# Patient Record
Sex: Female | Born: 1975
Health system: Southern US, Community
[De-identification: ages and names within clinical notes are randomized; demographics above are authoritative.]

## PROBLEM LIST (undated history)

## (undated) ENCOUNTER — Ambulatory Visit (HOSPITAL_COMMUNITY)

## (undated) DIAGNOSIS — G4730 Sleep apnea, unspecified: Secondary | ICD-10-CM

## (undated) DIAGNOSIS — N200 Calculus of kidney: Secondary | ICD-10-CM

## (undated) DIAGNOSIS — T7840XA Allergy, unspecified, initial encounter: Secondary | ICD-10-CM

## (undated) DIAGNOSIS — J189 Pneumonia, unspecified organism: Secondary | ICD-10-CM

## (undated) DIAGNOSIS — J159 Unspecified bacterial pneumonia: Secondary | ICD-10-CM

## (undated) DIAGNOSIS — I10 Essential (primary) hypertension: Secondary | ICD-10-CM

## (undated) DIAGNOSIS — Z8489 Family history of other specified conditions: Secondary | ICD-10-CM

## (undated) DIAGNOSIS — J45909 Unspecified asthma, uncomplicated: Secondary | ICD-10-CM

## (undated) DIAGNOSIS — K219 Gastro-esophageal reflux disease without esophagitis: Secondary | ICD-10-CM

## (undated) DIAGNOSIS — M1990 Unspecified osteoarthritis, unspecified site: Secondary | ICD-10-CM

## (undated) DIAGNOSIS — J42 Unspecified chronic bronchitis: Secondary | ICD-10-CM

## (undated) DIAGNOSIS — F419 Anxiety disorder, unspecified: Secondary | ICD-10-CM

## (undated) HISTORY — DX: Allergy, unspecified, initial encounter: T78.40XA

## (undated) HISTORY — PX: WISDOM TOOTH EXTRACTION: SHX21

## (undated) HISTORY — DX: Unspecified osteoarthritis, unspecified site: M19.90

## (undated) HISTORY — PX: CYSTOSCOPY WITH STENT PLACEMENT: SHX5790

## (undated) HISTORY — DX: Sleep apnea, unspecified: G47.30

## (undated) HISTORY — PX: SPINE SURGERY: SHX786

## (undated) HISTORY — DX: Unspecified asthma, uncomplicated: J45.909

## (undated) HISTORY — PX: BACK SURGERY: SHX140

---
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
## 2000-01-02 HISTORY — PX: TONSILLECTOMY: SUR1361

## 2006-01-01 HISTORY — PX: CARPAL TUNNEL RELEASE: SHX101

## 2007-03-25 ENCOUNTER — Encounter: Admission: RE | Admit: 2007-03-25 | Discharge: 2007-03-25 | Payer: Self-pay | Admitting: Family Medicine

## 2008-04-16 ENCOUNTER — Ambulatory Visit (HOSPITAL_COMMUNITY): Admission: RE | Admit: 2008-04-16 | Discharge: 2008-04-16 | Payer: Self-pay | Admitting: Urology

## 2008-07-21 ENCOUNTER — Encounter: Admission: RE | Admit: 2008-07-21 | Discharge: 2008-07-21 | Payer: Self-pay | Admitting: Family Medicine

## 2010-04-12 LAB — BASIC METABOLIC PANEL
Calcium: 9.1 mg/dL (ref 8.4–10.5)
GFR calc Af Amer: >60 mL/min (ref >60)
GFR calc non Af Amer: >60 mL/min (ref >60)
Sodium: 139 mEq/L (ref 135–145)

## 2010-04-12 LAB — CBC
Hemoglobin: 14.5 g/dL (ref 12.0–15.0)
RBC: 4.73 MIL/uL (ref 3.87–5.11)

## 2010-05-16 NOTE — Op Note (Signed)
NAMEJOLYN, Katrina Murphy            ACCOUNT NO.:  [id number]   MEDICAL RECORD NO.:  [id number]          PATIENT TYPE:  AMB   LOCATION:  DAY                          FACILITY:  [facility]   PHYSICIAN:  Ronald L. Earlene Plater, M.D.  DATE OF BIRTH:  02-26-1975   DATE OF PROCEDURE:  04/16/2008  DATE OF DISCHARGE:                               OPERATIVE REPORT   DIAGNOSIS:  Possible ureterolithiasis with left flank pain for 3 days.   OPERATIVE PROCEDURE:  Cystourethroscopy, left ureteroscopy, dilatation  of left distal ureteral stricture and placement of left double-J stent.   SURGEON:  Dr. Gaynelle Arabian.   ANESTHESIA:  General.   ESTIMATED BLOOD LOSS:  Negligible.   TUBE:  A 26 cm 6-French contoured double pigtail stent with a pullout  string.   COMPLICATIONS:  None.   INDICATIONS FOR PROCEDURE:  [name] is a very nice 35 year old  white female who has had multiple kidney stones in the past.  She  presented on Tuesday with left flank pain which was quite severe.  On CT  scan, she had one intrarenal calculus on the left side, but also had a  very small 2-3 mm calcification that was very difficult to discern  between a phlebolith and between the ureteral calculus.  Over the  ensuing 3 days, she has continued to have severe pain that feels just  like kidney stones to her and microhematuria.  Repeat CT scan again had  the calcification which was suspicious.  After understanding risks,  benefits and alternatives, she has elected to proceed with the above  procedure.   PROCEDURE IN DETAIL:  The patient was placed in the supine position.  After proper general anesthesia, she was placed in the dorsal lithotomy  position and prepped and draped with Betadine in a sterile fashion.  Cystourethroscopy was performed with a 22.5 [place] Olympus panendoscope.  The bladder was carefully inspected and noted to be without lesions.  Under fluoroscopic guidance a 0.038 French sensor wire was placed into  the left renal pelvis and the left distal ureter was dilated with the  inner dilating sheath of the ureteral access catheter.  It was noted to  be quite tight just above the ureteral orifice and suspicious for a  narrowing.  Ureteroscopy was then performed with a short thin 6-French  semi-rigid ureteroscope and the lower two-thirds of the ureter was  examined and noted to be without lesions.  The area of the distal ureter  just above the ureterovesical junction was noted to be somewhat inflamed  and either represented with a recent passage of stone or was a ureteral  stricture that was dilated.  Fluoroscopy was performed.  I could see no  stones on it.  The stone could have fragmented or potentially flushed  into the kidney, but I felt no further treatment was necessary at this  point.  The ureteroscope was visually removed.  There were no  perforations or bleeding noted.  Under fluoroscopic guidance, a 26-cm 6-  [place] contoured double  pigtail stent was placed and noted to be in good position within the  left renal  pelvis within the bladder.  A pullout string was attached.  The bladder was drained.  The panendoscope was removed.  Stent  localization was confirmed fluoroscopically and the patient was taken to  the recovery room stable.      Ronald L. Earlene Plater, M.D.  Electronically Signed     RLD/MEDQ  D:  04/16/2008  T:  04/17/2008  Job:  [id number]

## 2010-08-31 ENCOUNTER — Other Ambulatory Visit (HOSPITAL_COMMUNITY)
Admission: RE | Admit: 2010-08-31 | Discharge: 2010-08-31 | Disposition: A | Discharge status: Home / Self Care | Payer: 59 | Source: Ambulatory Visit | Attending: Family Medicine | Admitting: Family Medicine

## 2010-08-31 DIAGNOSIS — Z01419 Encounter for gynecological examination (general) (routine) without abnormal findings: Secondary | ICD-10-CM | POA: Insufficient documentation

## 2011-12-06 ENCOUNTER — Other Ambulatory Visit: Payer: Self-pay | Admitting: Gastroenterology

## 2011-12-06 DIAGNOSIS — R112 Nausea with vomiting, unspecified: Secondary | ICD-10-CM

## 2011-12-13 ENCOUNTER — Ambulatory Visit
Admission: RE | Admit: 2011-12-13 | Discharge: 2011-12-13 | Disposition: A | Discharge status: Home / Self Care | Payer: 59 | Source: Ambulatory Visit | Attending: Gastroenterology | Admitting: Gastroenterology

## 2011-12-13 DIAGNOSIS — R112 Nausea with vomiting, unspecified: Secondary | ICD-10-CM

## 2012-01-09 ENCOUNTER — Other Ambulatory Visit: Payer: 59

## 2012-06-13 ENCOUNTER — Other Ambulatory Visit: Payer: Self-pay | Admitting: Family Medicine

## 2012-06-13 ENCOUNTER — Ambulatory Visit
Admission: RE | Admit: 2012-06-13 | Discharge: 2012-06-13 | Disposition: A | Discharge status: Home / Self Care | Payer: 59 | Source: Ambulatory Visit | Attending: Family Medicine | Admitting: Family Medicine

## 2012-06-13 DIAGNOSIS — R52 Pain, unspecified: Secondary | ICD-10-CM

## 2012-09-05 ENCOUNTER — Other Ambulatory Visit: Payer: Self-pay | Admitting: Internal Medicine

## 2012-09-05 DIAGNOSIS — E270 Other adrenocortical overactivity: Secondary | ICD-10-CM

## 2012-09-12 ENCOUNTER — Other Ambulatory Visit: Payer: 59

## 2012-12-17 ENCOUNTER — Other Ambulatory Visit: Payer: Self-pay | Admitting: Urology

## 2012-12-17 ENCOUNTER — Encounter (HOSPITAL_COMMUNITY): Payer: Self-pay | Admitting: *Deleted

## 2012-12-19 ENCOUNTER — Ambulatory Visit (HOSPITAL_COMMUNITY)
Admission: RE | Admit: 2012-12-19 | Discharge: 2012-12-19 | Disposition: A | Discharge status: Home / Self Care | Payer: 59 | Source: Ambulatory Visit | Attending: Urology | Admitting: Urology

## 2012-12-19 ENCOUNTER — Encounter (HOSPITAL_COMMUNITY)
Admission: RE | Disposition: A | Discharge status: Home / Self Care | Payer: Self-pay | Source: Ambulatory Visit | Attending: Urology

## 2012-12-19 ENCOUNTER — Ambulatory Visit (HOSPITAL_COMMUNITY): Payer: 59 | Admitting: Anesthesiology

## 2012-12-19 ENCOUNTER — Ambulatory Visit (HOSPITAL_COMMUNITY): Payer: 59

## 2012-12-19 ENCOUNTER — Encounter (HOSPITAL_COMMUNITY): Payer: 59 | Admitting: Anesthesiology

## 2012-12-19 ENCOUNTER — Encounter (HOSPITAL_COMMUNITY): Payer: Self-pay | Admitting: *Deleted

## 2012-12-19 DIAGNOSIS — Z881 Allergy status to other antibiotic agents status: Secondary | ICD-10-CM | POA: Insufficient documentation

## 2012-12-19 DIAGNOSIS — E669 Obesity, unspecified: Secondary | ICD-10-CM | POA: Insufficient documentation

## 2012-12-19 DIAGNOSIS — N201 Calculus of ureter: Secondary | ICD-10-CM | POA: Insufficient documentation

## 2012-12-19 DIAGNOSIS — I10 Essential (primary) hypertension: Secondary | ICD-10-CM | POA: Insufficient documentation

## 2012-12-19 DIAGNOSIS — Z87891 Personal history of nicotine dependence: Secondary | ICD-10-CM | POA: Insufficient documentation

## 2012-12-19 DIAGNOSIS — K219 Gastro-esophageal reflux disease without esophagitis: Secondary | ICD-10-CM | POA: Insufficient documentation

## 2012-12-19 HISTORY — PX: HOLMIUM LASER APPLICATION: SHX5852

## 2012-12-19 HISTORY — DX: Gastro-esophageal reflux disease without esophagitis: K21.9

## 2012-12-19 HISTORY — DX: Essential (primary) hypertension: I10

## 2012-12-19 HISTORY — PX: CYSTOSCOPY WITH RETROGRADE PYELOGRAM, URETEROSCOPY AND STENT PLACEMENT: SHX5789

## 2012-12-19 HISTORY — DX: Family history of other specified conditions: Z84.89

## 2012-12-19 HISTORY — DX: Calculus of kidney: N20.0

## 2012-12-19 LAB — CBC
HCT: 40.3 % (ref 36.0–46.0)
Hemoglobin: 13.3 g/dL (ref 12.0–15.0)
MCH: 29.4 pg (ref 26.0–34.0)
MCV: 89.2 fL (ref 78.0–100.0)
RBC: 4.52 MIL/uL (ref 3.87–5.11)

## 2012-12-19 LAB — BASIC METABOLIC PANEL
CO2: 24 mEq/L (ref 19–32)
Calcium: 8.7 mg/dL (ref 8.4–10.5)
Creatinine, Ser: 0.58 mg/dL (ref 0.50–1.10)
Glucose, Bld: 89 mg/dL (ref 70–99)

## 2012-12-19 SURGERY — CYSTOURETEROSCOPY, WITH RETROGRADE PYELOGRAM AND STENT INSERTION
Anesthesia: General | Site: Ureter | Laterality: Left

## 2012-12-19 MED ORDER — KETOROLAC TROMETHAMINE 30 MG/ML IJ SOLN
INTRAMUSCULAR | Status: AC
  Filled 2012-12-19: qty 1

## 2012-12-19 MED ORDER — MEPERIDINE HCL 50 MG/ML IJ SOLN: 6.2500 mg | INTRAMUSCULAR | Status: DC | PRN

## 2012-12-19 MED ORDER — HYDROMORPHONE HCL PF 2 MG/ML IJ SOLN
INTRAMUSCULAR | Status: AC
  Filled 2012-12-19: qty 1

## 2012-12-19 MED ORDER — LACTATED RINGERS IV SOLN
INTRAVENOUS | Status: DC | PRN
  Administered 2012-12-19: 17:00:00 via INTRAVENOUS

## 2012-12-19 MED ORDER — PROPOFOL 10 MG/ML IV BOLUS
INTRAVENOUS | Status: AC
  Filled 2012-12-19: qty 20

## 2012-12-19 MED ORDER — IOHEXOL 300 MG/ML  SOLN
INTRAMUSCULAR | Status: DC | PRN
  Administered 2012-12-19: 15 mL via URETHRAL

## 2012-12-19 MED ORDER — KETOROLAC TROMETHAMINE 30 MG/ML IJ SOLN
INTRAMUSCULAR | Status: DC | PRN
  Administered 2012-12-19: 30 mg via INTRAVENOUS

## 2012-12-19 MED ORDER — MIDAZOLAM HCL 5 MG/5ML IJ SOLN
INTRAMUSCULAR | Status: DC | PRN
  Administered 2012-12-19: 2 mg via INTRAVENOUS

## 2012-12-19 MED ORDER — LIDOCAINE HCL 2 % EX GEL
CUTANEOUS | Status: AC
  Filled 2012-12-19: qty 10

## 2012-12-19 MED ORDER — OXYCODONE HCL 5 MG PO TABS: 5.0000 mg | ORAL_TABLET | Freq: Once | ORAL | Status: DC | PRN

## 2012-12-19 MED ORDER — GENTAMICIN IN SALINE 1.6-0.9 MG/ML-% IV SOLN: 80.0000 mg | INTRAVENOUS | Status: DC

## 2012-12-19 MED ORDER — DEXAMETHASONE SODIUM PHOSPHATE 4 MG/ML IJ SOLN
INTRAMUSCULAR | Status: DC | PRN
  Administered 2012-12-19: 10 mg via INTRAVENOUS

## 2012-12-19 MED ORDER — ONDANSETRON HCL 4 MG/2ML IJ SOLN
INTRAMUSCULAR | Status: DC | PRN
  Administered 2012-12-19: 4 mg via INTRAVENOUS

## 2012-12-19 MED ORDER — HYDROMORPHONE HCL PF 1 MG/ML IJ SOLN
INTRAMUSCULAR | Status: DC | PRN
  Administered 2012-12-19: 0.5 mg via INTRAVENOUS
  Administered 2012-12-19: 1 mg via INTRAVENOUS
  Administered 2012-12-19: 0.5 mg via INTRAVENOUS

## 2012-12-19 MED ORDER — GENTAMICIN SULFATE 40 MG/ML IJ SOLN
5.0000 mg/kg | INTRAVENOUS | Status: AC
  Administered 2012-12-19: 410 mg via INTRAVENOUS
  Filled 2012-12-19: qty 10.25

## 2012-12-19 MED ORDER — PROMETHAZINE HCL 25 MG/ML IJ SOLN: 6.2500 mg | INTRAMUSCULAR | Status: DC | PRN

## 2012-12-19 MED ORDER — CEPHALEXIN 250 MG PO CAPS: 250.0000 mg | ORAL_CAPSULE | Freq: Two times a day (BID) | ORAL | Status: DC

## 2012-12-19 MED ORDER — FENTANYL CITRATE 0.05 MG/ML IJ SOLN
INTRAMUSCULAR | Status: DC | PRN
  Administered 2012-12-19 (×4): 50 ug via INTRAVENOUS

## 2012-12-19 MED ORDER — FENTANYL CITRATE 0.05 MG/ML IJ SOLN
INTRAMUSCULAR | Status: AC
  Filled 2012-12-19: qty 2

## 2012-12-19 MED ORDER — HYDROMORPHONE HCL PF 1 MG/ML IJ SOLN: 0.2500 mg | INTRAMUSCULAR | Status: DC | PRN

## 2012-12-19 MED ORDER — ONDANSETRON HCL 4 MG/2ML IJ SOLN
INTRAMUSCULAR | Status: AC
  Filled 2012-12-19: qty 2

## 2012-12-19 MED ORDER — DEXAMETHASONE SODIUM PHOSPHATE 10 MG/ML IJ SOLN
INTRAMUSCULAR | Status: AC
  Filled 2012-12-19: qty 1

## 2012-12-19 MED ORDER — OXYCODONE HCL 5 MG/5ML PO SOLN
5.0000 mg | Freq: Once | ORAL | Status: DC | PRN
  Filled 2012-12-19: qty 5

## 2012-12-19 MED ORDER — PROPOFOL 10 MG/ML IV BOLUS
INTRAVENOUS | Status: DC | PRN
  Administered 2012-12-19: 100 mg via INTRAVENOUS
  Administered 2012-12-19: 200 mg via INTRAVENOUS

## 2012-12-19 MED ORDER — MIDAZOLAM HCL 2 MG/2ML IJ SOLN
INTRAMUSCULAR | Status: AC
  Filled 2012-12-19: qty 2

## 2012-12-19 SURGICAL SUPPLY — 21 items
BAG URINE DRAINAGE (UROLOGICAL SUPPLIES) ×3 IMPLANT
BASKET LASER NITINOL 1.9FR (BASKET) ×3 IMPLANT
BASKET STNLS GEMINI 4WIRE 3FR (BASKET) IMPLANT
BASKET ZERO TIP NITINOL 2.4FR (BASKET) IMPLANT
CATH INTERMIT  6FR 70CM (CATHETERS) ×3 IMPLANT
DRAPE CAMERA CLOSED 9X96 (DRAPES) ×3 IMPLANT
ELECT REM PT RETURN 9FT ADLT (ELECTROSURGICAL)
ELECTRODE REM PT RTRN 9FT ADLT (ELECTROSURGICAL) IMPLANT
FIBER LASER FLEXIVA 200 (UROLOGICAL SUPPLIES) ×3 IMPLANT
FIBER LASER FLEXIVA 365 (UROLOGICAL SUPPLIES) IMPLANT
GLOVE BIOGEL M STRL SZ7.5 (GLOVE) ×3 IMPLANT
GOWN PREVENTION PLUS LG XLONG (DISPOSABLE) ×3 IMPLANT
GUIDEWIRE ANG ZIPWIRE 038X150 (WIRE) ×6 IMPLANT
GUIDEWIRE STR DUAL SENSOR (WIRE) ×3 IMPLANT
IV NS IRRIG 3000ML ARTHROMATIC (IV SOLUTION) ×3 IMPLANT
PACK CYSTO (CUSTOM PROCEDURE TRAY) ×3 IMPLANT
SHEATH ACCESS URETERAL 24CM (SHEATH) ×3 IMPLANT
STENT POLARIS 5FRX24 (STENTS) ×3 IMPLANT
SYRINGE 10CC LL (SYRINGE) ×3 IMPLANT
SYRINGE IRR TOOMEY STRL 70CC (SYRINGE) IMPLANT
TUBE FEEDING 8FR 16IN STR KANG (MISCELLANEOUS) ×3 IMPLANT

## 2012-12-19 NOTE — Anesthesia Postprocedure Evaluation (Signed)
Anesthesia Post Note  Patient: Katrina Murphy  Procedure(s) Performed: Procedure(s) (LRB): CYSTOSCOPY WITH RETROGRADE PYELOGRAM, URETEROSCOPY AND STENT PLACEMENT (Left) HOLMIUM LASER APPLICATION (Left)  Anesthesia type: General  Patient location: PACU  Post pain: Pain level controlled  Post assessment: Post-op Vital signs reviewed  Last Vitals: BP 154/99  Pulse 69  Temp(Src) 36.5 C (Oral)  Resp 16  Ht 5\' 3"  (1.6 m)  Wt 281 lb 9.6 oz (127.733 kg)  BMI 49.90 kg/m2  SpO2 96%  LMP 12/16/2012  Post vital signs: Reviewed  Level of consciousness: sedated  Complications: No apparent anesthesia complications

## 2012-12-19 NOTE — Transfer of Care (Signed)
Immediate Anesthesia Transfer of Care Note  Patient: Katrina Murphy  Procedure(s) Performed: Procedure(s): CYSTOSCOPY WITH RETROGRADE PYELOGRAM, URETEROSCOPY AND STENT PLACEMENT (Left) HOLMIUM LASER APPLICATION (Left)  Patient Location: PACU  Anesthesia Type:General  Level of Consciousness: awake and alert   Airway & Oxygen Therapy: Patient Spontanous Breathing and Patient connected to face mask oxygen  Post-op Assessment: Report given to PACU RN and Post -op Vital signs reviewed and stable  Post vital signs: Reviewed and stable  Complications: No apparent anesthesia complications

## 2012-12-19 NOTE — Anesthesia Preprocedure Evaluation (Addendum)
Anesthesia Evaluation  Patient identified by MRN, date of birth, ID band Patient awake    Reviewed: Allergy & Precautions, H&P , NPO status , Patient's Chart, lab work & pertinent test results  Airway       Dental  (+) Dental Advisory Given   Pulmonary former smoker,          Cardiovascular hypertension, Pt. on medications     Neuro/Psych negative neurological ROS  negative psych ROS   GI/Hepatic Neg liver ROS, GERD-  ,  Endo/Other  negative endocrine ROSMorbid obesity  Renal/GU Renal disease     Musculoskeletal negative musculoskeletal ROS (+)   Abdominal (+) + obese,   Peds  Hematology negative hematology ROS (+)   Anesthesia Other Findings   Reproductive/Obstetrics negative OB ROS                         Anesthesia Physical Anesthesia Plan  ASA: III  Anesthesia Plan: General   Post-op Pain Management:    Induction: Intravenous  Airway Management Planned: LMA  Additional Equipment:   Intra-op Plan:   Post-operative Plan: Extubation in OR  Informed Consent: I have reviewed the patients History and Physical, chart, labs and discussed the procedure including the risks, benefits and alternatives for the proposed anesthesia with the patient or authorized representative who has indicated his/her understanding and acceptance.   Dental advisory given  Plan Discussed with: CRNA  Anesthesia Plan Comments:        Anesthesia Quick Evaluation

## 2012-12-19 NOTE — H&P (Signed)
Katrina Murphy is an 37 y.o. female.    Chief Complaint: Pre-OP Left Ureteroscopic Stone Manipulation  HPI:  1 -  Recurrent Nephrolithiais - Prior URS in 2003,2007,2010. 12/2012 - Left 5x42mm left proximal ureteral stone with mild-mod hydro. NO additional stones.   2 - Metabolic Stone Disease / Hypercalciuria / Hyperuricosuria -  Eval 2010: 24 hr urine - severe hypercalciuria (>700) and hyperuricosuria --> Allopurinol + HCTZ which she has been compliant with for years. Eval 2014: 24 hr urine - improved hypercalciuria (333) and hyperuicosuria, but still above normal limits  PMH sig for HTN, TNA, Back Surgery, GERD, Obesity. No CV disease. No strong blood thinners.  Today Lakshmi is seen to proceed with left ureteroscopic stone manipulation for her large symptomatic ureteral stone. No interval fevers. Most recent UA without infectious parameters.  Past Medical History  Diagnosis Date  . Hypertension   . GERD (gastroesophageal reflux disease)   . Family history of anesthesia complication     father- severe vomiting   . Kidney stones     Past Surgical History  Procedure Laterality Date  . Cystoscopy      x 4   . Tonsillectomy    . Carpal tunnel release      bilateral   . Wisdom tooth extraction    . Back surgery      lower back     History reviewed. No pertinent family history. Social History:  reports that she has quit smoking. She has never used smokeless tobacco. She reports that she does not drink alcohol or use illicit drugs.  Allergies:  Allergies  Allergen Reactions  . Sulfa Antibiotics Anaphylaxis  . Spironolactone Other (See Comments)    Hypotension, incoherent     No prescriptions prior to admission    No results found for this or any previous visit (from the past 48 hour(s)). No results found.  Review of Systems  Constitutional: Negative.  Negative for fever and chills.  HENT: Negative.   Eyes: Negative.   Respiratory: Negative.   Cardiovascular:  Negative.   Gastrointestinal: Positive for nausea and vomiting.  Genitourinary: Positive for flank pain. Negative for dysuria and urgency.  Musculoskeletal: Negative.   Skin: Negative.   Neurological: Negative.   Endo/Heme/Allergies: Negative.   Psychiatric/Behavioral: Negative.     Last menstrual period 12/16/2012. Physical Exam  Constitutional: She is oriented to person, place, and time. She appears well-developed and well-nourished.  HENT:  Head: Normocephalic and atraumatic.  Eyes: EOM are normal. Pupils are equal, round, and reactive to light.  Neck: Normal range of motion. Neck supple.  Cardiovascular: Normal rate and regular rhythm.   Respiratory: Effort normal and breath sounds normal.  GI: Soft. Bowel sounds are normal.  Obesity limits sensitivity of exam  Genitourinary:  Mod Left CVAT  Musculoskeletal: Normal range of motion.  Neurological: She is alert and oriented to person, place, and time.  Skin: Skin is warm and dry.  Psychiatric: She has a normal mood and affect. Her behavior is normal. Judgment and thought content normal.     Assessment/Plan  1 -  Recurrent Nephrolithiais - large left proximal ureteral stone by CT. Unlikley to pass on its own. SWL not first choice as hard to see on scout images due to body habitus.  We rediscussed ureteroscopic stone manipulation with basketing and laser-lithotripsy in detail.  We rediscussed risks including bleeding, infection, damage to kidney / ureter  bladder, rarely loss of kidney. We rediscussed anesthetic risks and rare but serious surgical  complications including DVT, PE, MI, and mortality. We specifically readdressed that in 5-10% of cases a staged approach is required with stenting followed by re-attempt ureteroscopy if anatomy unfavorable. The patient voiced understanding and wises to proceed today as planned.    2 - Metabolic Stone Disease / Hypercalciuria / Hyperuricosuria - WIll increase HCTZ and Allopurinol pending  current stoen composition based on 24 hr urines this year.   Parys Elenbaas 12/19/2012, 6:16 AM

## 2012-12-19 NOTE — Brief Op Note (Signed)
12/19/2012  6:01 PM  PATIENT:  Katrina Murphy  37 y.o. female  PRE-OPERATIVE DIAGNOSIS:  LEFT URETERAL STONE  POST-OPERATIVE DIAGNOSIS:  LEFT URETERAL STONE  PROCEDURE:  Procedure(s): CYSTOSCOPY WITH RETROGRADE PYELOGRAM, URETEROSCOPY AND STENT PLACEMENT (Left) HOLMIUM LASER APPLICATION (Left)  SURGEON:  Surgeon(s) and Role:    * Sebastian Ache, MD - Primary  PHYSICIAN ASSISTANT:   ASSISTANTS: none   ANESTHESIA:   general  EBL:     BLOOD ADMINISTERED:none  DRAINS: none   LOCAL MEDICATIONS USED:  NONE  SPECIMEN:  Source of Specimen:  Lt Ureteral Stone Fragments  DISPOSITION OF SPECIMEN:  [facility] for compositional analysis  COUNTS:  YES  TOURNIQUET:  * No tourniquets in log *  DICTATION: .Other Dictation: Dictation Number [id number]  PLAN OF CARE: Discharge to home after PACU  PATIENT DISPOSITION:  PACU - hemodynamically stable.   Delay start of Pharmacological VTE agent (>24hrs) due to surgical blood loss or risk of bleeding: not applicable

## 2012-12-20 NOTE — Op Note (Signed)
NAMEGRISELLE, RUFER            ACCOUNT NO.:  [id number]  MEDICAL RECORD NO.:  [id number]  LOCATION:  [facility]                         FACILITY:  [facility]  PHYSICIAN:  Sebastian Ache, MD     DATE OF BIRTH:  1975-06-11  DATE OF PROCEDURE: 12/19/2012 DATE OF DISCHARGE:  12/19/2012                              OPERATIVE REPORT   DIAGNOSES:  Left ureteral stone refractory renal colic.  PROCEDURE: 1. Cystoscopy with left retrograde pyelogram and interpretation. 2. Left ureteroscopy with laser lithotripsy. 3. Insertion of left ureteral stent, 5 x 24 with tether to the thigh.  ESTIMATED BLOOD LOSS:  Nil.  COMPLICATIONS:  None.  SPECIMENS:  Left ureteral stone for compositional analysis.  FINDINGS: 1. Unremarkable urinary bladder. 2. Left retrograde pyelogram filling defect in proximal ureter     consistent with known stone. 3. Proximal mild hydroureteronephrosis. 4. No additional left sided renal / ureteral calculi.  INDICATION:  Ms. Fehnel is a pleasant 37 year old lady with history of recurrent nephrolithiasis as well as hypercalciuria and hyperuricosuria. She presented earlier this week with acute left renal colic.  CT scan corroborated a large void left ureteral stone.  Options were discussed including medical therapy versus shockwave lithotripsy versus ureteroscopy and she adamantly wished to proceed with the latter. Informed consent was obtained and placed in medical record.  PROCEDURE IN DETAIL:  The patient being Yamari Ventola was verified. Procedure being left ureteroscopic stone manipulation was confirmed. The procedure was carried out.  Time-out was performed.  Intravenous antibiotics administered.  General LMA anesthesia was introduced.  The patient was placed into a low lithotomy position.  Sterile field was created by prepping and draping the patient's vagina, introitus, and proximal thighs using iodine x3.  Next, cystourethroscopy was performed using 22-French  rigid cystoscope with 12-degree offset lens.  Inspection of the urinary bladder revealed no diverticula, calcifications, papular lesions.  The left ureteral orifice was cannulated with a 6-French end- hole catheter and left retrograde pyelogram was obtained.  Left retrograde pyelogram demonstrated a single left ureter with single system left kidney.  There was a filling defect in proximal ureter consistent with known stone.  There was mild-to-moderate hydroureteronephrosis above this, a 0.038 Glidewire was advanced at the level of the upper pole and set aside as a safety wire.  An 8-French draining tube was placed in the urinary bladder for pressure release. Next, semi-rigid ureteroscopy was performed in the distal two-thirds of the left ureter alongside a separate Sensor working wire at the maximum regions of the semi-rigid ureteroscope.  The proximal ureteral stone in question was indeed seen.  Due to being in the limit of the scope, we felt that flexible technique would be more advantageous as such the semi- rigid ureteroscope was exchanged for a 24 cm 12/14 ureteral access sheath, which was placed over the sensor working wire using continuous fluoroscopic guidance at the level of the proximal ureter.  Next, flexible digital ureteroscopy was performed using 8-French digital ureteroscope.  The stone in question was found and it was appeared to be much too large for simple basketing as such, holmium laser energy was applied to the stone using settings of 0.6 joules and 6 hertz fragmenting the stone  approximately 4 smaller pieces.  These were each grasped with escape basket and brought out in their entirety set aside for compositional analysis.  Systematic inspection above this stone including inspection of all renal calices x2 revealed no additional stone fragments.  There was no evidence of perforation.  There was excellent hemostasis.  The sheath was removed under  continuous ureteroscopic vision and no mucosal abnormalities were found.  Finally, a new 5 x 24 Polaris stent was placed through the remaining safety wire and good proximal and distal curl were noted.  Bladder was emptied per cystoscope.  Procedure was then terminated.  The patient tolerated the procedure well.  There were no immediate periprocedural complications. The patient was taken to the Postanesthesia Care Unit in stable condition.          ______________________________ Sebastian Ache, MD     TM/MEDQ  D:  12/19/2012  T:  12/20/2012  Job:  [id number]

## 2012-12-22 ENCOUNTER — Encounter (HOSPITAL_COMMUNITY): Payer: Self-pay | Admitting: Urology

## 2014-02-08 ENCOUNTER — Ambulatory Visit
Admission: RE | Admit: 2014-02-08 | Discharge: 2014-02-08 | Disposition: A | Discharge status: Home / Self Care | Payer: 59 | Source: Ambulatory Visit | Attending: Family | Admitting: Family

## 2014-02-08 ENCOUNTER — Other Ambulatory Visit: Payer: Self-pay | Admitting: Family

## 2014-02-08 DIAGNOSIS — R059 Cough, unspecified: Secondary | ICD-10-CM

## 2014-02-08 DIAGNOSIS — R0989 Other specified symptoms and signs involving the circulatory and respiratory systems: Secondary | ICD-10-CM

## 2014-02-08 DIAGNOSIS — R05 Cough: Secondary | ICD-10-CM

## 2014-02-08 DIAGNOSIS — J159 Unspecified bacterial pneumonia: Secondary | ICD-10-CM

## 2014-02-08 HISTORY — DX: Unspecified bacterial pneumonia: J15.9

## 2014-02-16 ENCOUNTER — Inpatient Hospital Stay (HOSPITAL_COMMUNITY)
Admission: AD | Admit: 2014-02-16 | Discharge: 2014-02-18 | DRG: 195 | Disposition: A | Discharge status: Home / Self Care | Payer: 59 | Source: Ambulatory Visit | Attending: Internal Medicine | Admitting: Internal Medicine

## 2014-02-16 ENCOUNTER — Inpatient Hospital Stay (HOSPITAL_COMMUNITY): Payer: 59

## 2014-02-16 ENCOUNTER — Encounter (HOSPITAL_COMMUNITY): Payer: Self-pay | Admitting: General Practice

## 2014-02-16 DIAGNOSIS — E790 Hyperuricemia without signs of inflammatory arthritis and tophaceous disease: Secondary | ICD-10-CM

## 2014-02-16 DIAGNOSIS — F329 Major depressive disorder, single episode, unspecified: Secondary | ICD-10-CM | POA: Diagnosis present

## 2014-02-16 DIAGNOSIS — K219 Gastro-esophageal reflux disease without esophagitis: Secondary | ICD-10-CM

## 2014-02-16 DIAGNOSIS — J189 Pneumonia, unspecified organism: Secondary | ICD-10-CM | POA: Diagnosis present

## 2014-02-16 DIAGNOSIS — I10 Essential (primary) hypertension: Secondary | ICD-10-CM

## 2014-02-16 DIAGNOSIS — F419 Anxiety disorder, unspecified: Secondary | ICD-10-CM | POA: Diagnosis present

## 2014-02-16 DIAGNOSIS — N200 Calculus of kidney: Secondary | ICD-10-CM | POA: Diagnosis present

## 2014-02-16 DIAGNOSIS — J42 Unspecified chronic bronchitis: Secondary | ICD-10-CM | POA: Diagnosis present

## 2014-02-16 DIAGNOSIS — Z87891 Personal history of nicotine dependence: Secondary | ICD-10-CM | POA: Diagnosis not present

## 2014-02-16 DIAGNOSIS — J45909 Unspecified asthma, uncomplicated: Secondary | ICD-10-CM | POA: Diagnosis present

## 2014-02-16 DIAGNOSIS — J45901 Unspecified asthma with (acute) exacerbation: Secondary | ICD-10-CM

## 2014-02-16 HISTORY — DX: Unspecified bacterial pneumonia: J15.9

## 2014-02-16 HISTORY — DX: Unspecified chronic bronchitis: J42

## 2014-02-16 HISTORY — DX: Pneumonia, unspecified organism: J18.9

## 2014-02-16 HISTORY — DX: Anxiety disorder, unspecified: F41.9

## 2014-02-16 LAB — BASIC METABOLIC PANEL
ANION GAP: 6 (ref 5–15)
BUN: 8 mg/dL (ref 6–23)
CALCIUM: 9.1 mg/dL (ref 8.4–10.5)
CO2: 28 mmol/L (ref 19–32)
Chloride: 105 mmol/L (ref 96–112)
Creatinine, Ser: 0.82 mg/dL (ref 0.50–1.10)
GFR, EST NON AFRICAN AMERICAN: 90 mL/min — AB (ref >90)
Glucose, Bld: 98 mg/dL (ref 70–99)
Potassium: 6.3 mmol/L (ref 3.5–5.1)
SODIUM: 139 mmol/L (ref 135–145)

## 2014-02-16 LAB — URINALYSIS, ROUTINE W REFLEX MICROSCOPIC
GLUCOSE, UA: NEGATIVE mg/dL
Ketones, ur: 15 mg/dL — AB
Leukocytes, UA: NEGATIVE
Nitrite: NEGATIVE
PH: 6 (ref 5.0–8.0)
Protein, ur: NEGATIVE mg/dL
Specific Gravity, Urine: 1.028 (ref 1.005–1.030)
Urobilinogen, UA: 0.2 mg/dL (ref 0.0–1.0)

## 2014-02-16 LAB — CBC
HEMATOCRIT: 41.5 % (ref 36.0–46.0)
Hemoglobin: 13.8 g/dL (ref 12.0–15.0)
MCH: 29.4 pg (ref 26.0–34.0)
MCHC: 33.3 g/dL (ref 30.0–36.0)
MCV: 88.5 fL (ref 78.0–100.0)
Platelets: 298 10*3/uL (ref 150–400)
RBC: 4.69 MIL/uL (ref 3.87–5.11)
RDW: 13.5 % (ref 11.5–15.5)
WBC: 10.3 10*3/uL (ref 4.0–10.5)

## 2014-02-16 LAB — URINE MICROSCOPIC-ADD ON

## 2014-02-16 LAB — STREP PNEUMONIAE URINARY ANTIGEN: Strep Pneumo Urinary Antigen: NEGATIVE

## 2014-02-16 MED ORDER — ONDANSETRON HCL 4 MG/2ML IJ SOLN
4.0000 mg | Freq: Four times a day (QID) | INTRAMUSCULAR | Status: DC | PRN
  Administered 2014-02-17 (×2): 4 mg via INTRAVENOUS
  Filled 2014-02-16 (×2): qty 2

## 2014-02-16 MED ORDER — ALLOPURINOL 100 MG PO TABS
100.0000 mg | ORAL_TABLET | Freq: Every day | ORAL | Status: DC
  Administered 2014-02-17 – 2014-02-18 (×2): 100 mg via ORAL
  Filled 2014-02-16 (×3): qty 1

## 2014-02-16 MED ORDER — LISINOPRIL 10 MG PO TABS
10.0000 mg | ORAL_TABLET | Freq: Every day | ORAL | Status: DC
  Administered 2014-02-17 – 2014-02-18 (×2): 10 mg via ORAL
  Filled 2014-02-16 (×3): qty 1

## 2014-02-16 MED ORDER — GUAIFENESIN ER 600 MG PO TB12
600.0000 mg | ORAL_TABLET | Freq: Two times a day (BID) | ORAL | Status: DC
  Administered 2014-02-16 – 2014-02-18 (×4): 600 mg via ORAL
  Filled 2014-02-16 (×4): qty 1

## 2014-02-16 MED ORDER — IPRATROPIUM-ALBUTEROL 0.5-2.5 (3) MG/3ML IN SOLN: 3.0000 mL | RESPIRATORY_TRACT | Status: DC | PRN

## 2014-02-16 MED ORDER — GUAIFENESIN 100 MG/5ML PO SYRP
200.0000 mg | ORAL_SOLUTION | ORAL | Status: DC | PRN
  Administered 2014-02-16: 200 mg via ORAL
  Filled 2014-02-16 (×3): qty 10

## 2014-02-16 MED ORDER — CEFTRIAXONE SODIUM IN DEXTROSE 20 MG/ML IV SOLN
1.0000 g | INTRAVENOUS | Status: DC
  Administered 2014-02-16: 1 g via INTRAVENOUS
  Filled 2014-02-16 (×4): qty 50

## 2014-02-16 MED ORDER — NORGESTIM-ETH ESTRAD TRIPHASIC 0.18/0.215/0.25 MG-35 MCG PO TABS: 1.0000 | ORAL_TABLET | Freq: Every day | ORAL | Status: DC

## 2014-02-16 MED ORDER — HYDROCODONE-ACETAMINOPHEN 5-325 MG PO TABS
1.0000 | ORAL_TABLET | Freq: Four times a day (QID) | ORAL | Status: DC | PRN
  Administered 2014-02-17: 1 via ORAL
  Filled 2014-02-16: qty 1

## 2014-02-16 MED ORDER — VITAMIN C 500 MG PO TABS
500.0000 mg | ORAL_TABLET | Freq: Every day | ORAL | Status: DC
  Administered 2014-02-17 – 2014-02-18 (×2): 500 mg via ORAL
  Filled 2014-02-16 (×3): qty 1

## 2014-02-16 MED ORDER — SODIUM CHLORIDE 0.9 % IV SOLN
INTRAVENOUS | Status: DC
  Administered 2014-02-16 – 2014-02-17 (×2): via INTRAVENOUS

## 2014-02-16 MED ORDER — HYDROCOD POLST-CHLORPHEN POLST 10-8 MG/5ML PO LQCR
5.0000 mL | Freq: Every day | ORAL | Status: DC
Start: 2014-02-16 — End: 2014-02-17
  Administered 2014-02-16: 5 mL via ORAL
  Filled 2014-02-16: qty 5

## 2014-02-16 MED ORDER — PANTOPRAZOLE SODIUM 20 MG PO TBEC
20.0000 mg | DELAYED_RELEASE_TABLET | Freq: Every day | ORAL | Status: DC
  Administered 2014-02-17 – 2014-02-18 (×2): 20 mg via ORAL
  Filled 2014-02-16 (×3): qty 1

## 2014-02-16 MED ORDER — MENTHOL 3 MG MT LOZG
1.0000 | LOZENGE | OROMUCOSAL | Status: DC | PRN
  Filled 2014-02-16: qty 9

## 2014-02-16 MED ORDER — ALBUTEROL SULFATE (2.5 MG/3ML) 0.083% IN NEBU
2.5000 mg | INHALATION_SOLUTION | RESPIRATORY_TRACT | Status: DC | PRN
  Administered 2014-02-17: 2.5 mg via RESPIRATORY_TRACT
  Filled 2014-02-16: qty 3

## 2014-02-16 MED ORDER — ACETAMINOPHEN 325 MG PO TABS
650.0000 mg | ORAL_TABLET | Freq: Four times a day (QID) | ORAL | Status: DC | PRN
  Administered 2014-02-17: 325 mg via ORAL
  Administered 2014-02-17: 650 mg via ORAL
  Filled 2014-02-16 (×2): qty 2

## 2014-02-16 MED ORDER — ESCITALOPRAM OXALATE 10 MG PO TABS
10.0000 mg | ORAL_TABLET | Freq: Every day | ORAL | Status: DC
  Administered 2014-02-17 – 2014-02-18 (×2): 10 mg via ORAL
  Filled 2014-02-16 (×3): qty 1

## 2014-02-16 MED ORDER — ENOXAPARIN SODIUM 40 MG/0.4ML ~~LOC~~ SOLN
40.0000 mg | SUBCUTANEOUS | Status: DC
  Administered 2014-02-16 – 2014-02-17 (×2): 40 mg via SUBCUTANEOUS
  Filled 2014-02-16 (×2): qty 0.4

## 2014-02-16 MED ORDER — DEXTROSE 5 % IV SOLN
500.0000 mg | INTRAVENOUS | Status: DC
  Administered 2014-02-16: 500 mg via INTRAVENOUS
  Filled 2014-02-16 (×4): qty 500

## 2014-02-16 NOTE — Progress Notes (Signed)
Pt admitted to the unit. MD notified of pt's arrival and awaiting on orders.

## 2014-02-16 NOTE — H&P (Signed)
[facility] History and Physical                                                                                    Katrina Murphy, is a 39 y.o. female  MRN: [id number]   DOB - 1975/07/13  Admit Date - 02/16/2014  Outpatient Primary MD for the patient is FULP, CAMMIE, MD  With History of -  Past Medical History  Diagnosis Date  . Hypertension   . GERD (gastroesophageal reflux disease)   . Kidney stones   . Family history of anesthesia complication     father- severe vomiting   . Pneumonia 1980's X 1; 2000  . Bacterial pneumonia 02/08/2014  . Chronic bronchitis     "got it q yr when I smoked" (02/16/2014)  . Anxiety       Past Surgical History  Procedure Laterality Date  . Cystoscopy with stent placement  ~ 2004; ~ 2006; ~ 2010    "took all the stents out myself" (02/16/2014)  . Tonsillectomy  2002    "one has grown back"  . Carpal tunnel release Bilateral 2008  . Wisdom tooth extraction    . Back surgery    . Cystoscopy with retrograde pyelogram, ureteroscopy and stent placement Left 12/19/2012    Procedure: CYSTOSCOPY WITH RETROGRADE PYELOGRAM, URETEROSCOPY AND STENT PLACEMENT;  Surgeon: Sebastian Ache, MD;  Location: [facility];  Service: Urology;  Laterality: Left;  . Holmium laser application Left 12/19/2012    Procedure: HOLMIUM LASER APPLICATION;  Surgeon: Sebastian Ache, MD;  Location: [facility];  Service: Urology;  Laterality: Left;  . Lumbar disc surgery  1992    "removed herniated pieces L4-5"    in for   No chief complaint on file.    HPI  Katrina Murphy  is a 39 y.o. female, with a PMH of tobacco abuse, GERD, and kidney stones who has been coughing green and brown phlegm for 2 weeks.  She received a chest x-ray at her PCPs office and was found to have left middle lobe pneumonia 1 week ago. She was placed on Levaquin 750 mg daily. Yesterday she returned her primary care physician's office. Her symptoms were the same or worse.  She was found to have  rhonchorous breath sounds, still coughing up dark sputum now streaked with blood. The patient reports losing 13 pounds in the past 10 days. She states her urine is dark.   Review of Systems   In addition to the HPI above,  No sick contacts  No changes with Vision or hearing, No problems swallowing food or Liquids, + Pleuritic chest pain  + Posttussive vomiting  No Blood in stool or Urine, No dysuria, No new skin rashes or bruises, No new joints pains-aches,  No new weakness, tingling, numbness in any extremity,  A full 10 point Review of Systems was done, except as stated above, all other Review of Systems were negative.  Social History History  Substance Use Topics  . Smoking status: Former Smoker -- 0.50 packs/day for 15 years    Types: Cigarettes    Quit date: 01/07/2009  . Smokeless tobacco: Never Used  . Alcohol Use: Yes  Comment: 02/16/2014 "might drink 1 drink/month; if that"   she smoked one half pack a day from the time she was 17 until she was 32. She denies recreational drug use. She admits to rare alcohol use   she lives at home alone, and works for [employer]  Family History There is no known history of lung disease in the family   Prior to Admission medications   Medication Sig Start Date End Date Taking? Authorizing Provider  albuterol (PROVENTIL HFA;VENTOLIN HFA) 108 (90 BASE) MCG/ACT inhaler Inhale 2 puffs into the lungs every 4 (four) hours as needed for wheezing or shortness of breath.   Yes Historical Provider, MD  allopurinol (ZYLOPRIM) 100 MG tablet Take 100 mg by mouth daily. Patient takes in am   Yes Historical Provider, MD  benzonatate (TESSALON) 100 MG capsule Take 100 mg by mouth 3 (three) times daily as needed for cough.   Yes Historical Provider, MD  cephALEXin (KEFLEX) 500 MG capsule Take 500 mg by mouth 2 (two) times daily.   Yes Historical Provider, MD  escitalopram (LEXAPRO) 10 MG tablet Take 10 mg by mouth daily.   Yes Historical  Provider, MD  hydrochlorothiazide (HYDRODIURIL) 25 MG tablet Take 25 mg by mouth daily. Patient takes in am   Yes Historical Provider, MD  HYDROcodone-acetaminophen (NORCO/VICODIN) 5-325 MG per tablet Take 1 tablet by mouth every 6 (six) hours as needed for moderate pain (back trouble).   Yes Historical Provider, MD  ibuprofen (ADVIL,MOTRIN) 200 MG tablet Take 200 mg by mouth every 6 (six) hours as needed.   Yes Historical Provider, MD  lansoprazole (PREVACID) 15 MG capsule Take 15 mg by mouth daily at 12 noon.   Yes Historical Provider, MD  levofloxacin (LEVAQUIN) 750 MG tablet Take 750 mg by mouth daily. Take 1 tablet once a day starting 7 days .   Yes Historical Provider, MD  lisinopril (PRINIVIL,ZESTRIL) 10 MG tablet Take 10 mg by mouth daily. Patient takes in am   Yes Historical Provider, MD  Multiple Vitamin (MULTIVITAMIN) tablet Take 1 tablet by mouth daily.   Yes Historical Provider, MD  PRESCRIPTION MEDICATION Apply 1 application topically 2 (two) times daily as needed (for acne outbreak).   Yes Historical Provider, MD  valACYclovir (VALTREX) 1000 MG tablet Take 1,000 mg by mouth 2 (two) times daily. Take 1000 mg twice daily for 7 days for cold sore.   Yes Historical Provider, MD  vitamin C (ASCORBIC ACID) 500 MG tablet Take 500 mg by mouth daily.   Yes Historical Provider, MD  cephALEXin (KEFLEX) 250 MG capsule Take 1 capsule (250 mg total) by mouth 2 (two) times daily. X 4 days to prevent post-op infection. Patient not taking: Reported on 02/16/2014 12/19/12   Sebastian Acheheodore Manny, MD  [facility](ORTHO TRI-CYCLEN, 28, PO) Take 1 tablet by mouth daily.     Historical Provider, MD  ondansetron (ZOFRAN) 4 MG tablet Take 4 mg by mouth every 8 (eight) hours as needed for nausea or vomiting.    Historical Provider, MD    Allergies  Allergen Reactions  . Sulfa Antibiotics Anaphylaxis  . Sucralose Other (See Comments)    Pt experience headache, N/V with all artificial  sweeteners.   Marland Kitchen. Spironolactone Other (See Comments)    Hypotension, incoherent     Physical Exam  Vitals  Blood pressure 148/88, pulse 89, temperature 98.9 F (37.2 C), temperature source Oral, resp. rate 20, last menstrual period 02/12/2014, SpO2 94 %.   General:  well-developed, obese, pleasant female, lying in bed in NAD  Psych:  Normal affect and insight, Not Suicidal or Homicidal, Awake Alert, Oriented X 3.  Neuro:   No F.N deficits, ALL C.Nerves Intact, Strength 5/5 all 4 extremities, Sensation intact all 4 extremities.  ENT:  Ears and Eyes appear Normal, Conjunctivae clear, PER. Oropharynx is red without exudates. She has moist mucous membranes.   Neck:  Supple, No lymphadenopathy appreciated  Respiratory:  Symmetrical chest wall movement, Good air movement bilaterally, CTAB.  Cardiac:  RRR, No Murmurs, no LE edema noted, no JVD.    Abdomen:  Positive bowel sounds, Soft, Non tender, Non distended,  No masses appreciated  Skin:  No Cyanosis, Normal Skin Turgor, No Skin Rash or Bruise.  Extremities:  Able to move all 4. 5/5 strength in each,  no effusions.  Data Review In  Imaging results:   Dg Chest 2 View  02/08/2014   CLINICAL DATA:  Cough and congestion .  EXAM: CHEST  2 VIEW  COMPARISON:  None.  FINDINGS: Mediastinum hilar structures are normal. Left mid lung field infiltrate noted consistent pneumonia. Right lung clear. No pleural effusion or pneumothorax. Heart size normal. Pulmonary vascularity normal.  IMPRESSION: Left mid lung field acute infiltrate consistent with pneumonia .   Electronically Signed   By: Maisie Fus  Register   On: 02/08/2014 14:23       Assessment & Plan  Active Problems:   Pneumonia   Kidney stones   Hypertension   Chronic bronchitis   GERD (gastroesophageal reflux disease)   Anxiety  Community acquired pneumonia Failed outpatient treatment with Levaquin. Will admit under the pneumonia focused order set for IV antibiotics, repeat  chest x-ray and supportive care. CBC, bmet, UA are pending in addition to the pneumonia order set lab work. Supportive care with nebulizers, mucolytics, cough suppressant  Kidney stones Continue allopurinol  Hypertension Continue lisinopril    GERD Placed on PPI therapy.   DVT Prophylaxis: Lovenox   Family Communication:   patient is alert and oriented, and understands her plan of care   Code Status:   full code   Condition:  guarded but stable   Time spent in minutes : [address],  PA-C on 02/16/2014 at 6:01 PM  Between 7am to 7pm - Pager - [phone]  After 7pm go to [email or web address] - password [email or web address]  And look for the night coverage person covering me after hours  [facility]

## 2014-02-17 LAB — INFLUENZA PANEL BY PCR (TYPE A & B)
H1N1 flu by pcr: NOT DETECTED
Influenza A By PCR: NEGATIVE
Influenza B By PCR: NEGATIVE

## 2014-02-17 LAB — BASIC METABOLIC PANEL
Anion gap: 4 — ABNORMAL LOW (ref 5–15)
BUN: <5 mg/dL — ABNORMAL LOW (ref 6–23)
CO2: 29 mmol/L (ref 19–32)
Calcium: 8.4 mg/dL (ref 8.4–10.5)
Chloride: 105 mmol/L (ref 96–112)
Creatinine, Ser: 0.75 mg/dL (ref 0.50–1.10)
GFR calc Af Amer: >90 mL/min (ref >90)
GFR calc non Af Amer: >90 mL/min (ref >90)
Glucose, Bld: 91 mg/dL (ref 70–99)
Potassium: 3.6 mmol/L (ref 3.5–5.1)
Sodium: 138 mmol/L (ref 135–145)

## 2014-02-17 LAB — LEGIONELLA ANTIGEN, URINE

## 2014-02-17 LAB — EXPECTORATED SPUTUM ASSESSMENT W GRAM STAIN, RFLX TO RESP C

## 2014-02-17 LAB — EXPECTORATED SPUTUM ASSESSMENT W REFEX TO RESP CULTURE

## 2014-02-17 MED ORDER — HYDROCOD POLST-CHLORPHEN POLST 10-8 MG/5ML PO LQCR
5.0000 mL | Freq: Two times a day (BID) | ORAL | Status: DC
  Administered 2014-02-17 – 2014-02-18 (×3): 5 mL via ORAL
  Filled 2014-02-17 (×3): qty 5

## 2014-02-17 MED ORDER — BUDESONIDE 0.5 MG/2ML IN SUSP
0.2500 mg | Freq: Two times a day (BID) | RESPIRATORY_TRACT | Status: DC
  Administered 2014-02-17 – 2014-02-18 (×3): 0.25 mg via RESPIRATORY_TRACT
  Filled 2014-02-17 (×2): qty 2

## 2014-02-17 MED ORDER — BENZONATATE 100 MG PO CAPS
100.0000 mg | ORAL_CAPSULE | Freq: Three times a day (TID) | ORAL | Status: DC
  Administered 2014-02-17 – 2014-02-18 (×4): 100 mg via ORAL
  Filled 2014-02-17 (×4): qty 1

## 2014-02-17 NOTE — Progress Notes (Signed)
CRITICAL VALUE ALERT  Critical value received:  Potassium=6.3  Date of notification:  02/16/14  Time of notification:  2143  Critical value read back:Yes.    Nurse who received alert:  Fabio Neighborselso Talecia Sherlin   MD notified (1st page):  Kirtland BouchardK. Schorr   Time of first page:  0200  MD notified (2nd page):  Time of second page:  Responding MD:  Merdis DelayK. Schorr  Time MD responded:  [id number]  New order: redraw blood for BMP STAT Phlebotomist made aware.

## 2014-02-17 NOTE — Progress Notes (Signed)
Nutrition Brief Note  Patient identified on the Malnutrition Screening Tool (MST) Report  Wt Readings from Last 15 Encounters:  02/17/14 285 lb (129.275 kg)  12/19/12 281 lb 9.6 oz (127.733 kg)   Katrina FailMichele Clair is a 39 y.o. female, with a PMH of tobacco abuse, GERD, and kidney stones who has been coughing green and brown phlegm for 2 weeks. She received a chest x-ray at her PCPs office and was found to have left middle lobe pneumonia 1 week ago.   Pt sleeping soundly at time of visit. Wt hx reveals UBW of 281# approximately one year ago as stable. No documented meal intake available, but noted multiple beverages at bedside.   Body mass index is 50.5 kg/(m^2). Patient meets criteria for extreme obesity, class III based on current BMI.   Current diet order is Heart Healthy, patient is consuming approximately n/a% of meals at this time. Labs and medications reviewed.   No nutrition interventions warranted at this time. If nutrition issues arise, please consult RD.   Denny Mccree A. Mayford KnifeWilliams, RD, LDN, CDE Pager: [phone] After hours Pager: [phone]

## 2014-02-17 NOTE — Progress Notes (Signed)
PT was ID using MRN, birth date and name

## 2014-02-17 NOTE — Progress Notes (Signed)
[facility] Progress Note   Katrina FailMichele Quant UJW:[id number] DOB: Aug 24, 1975 DOA: 02/16/2014 PCP: Cain SaupeFULP, CAMMIE, MD  Brief narrative: Katrina Murphy is a 39 y.o. female with a PMH of tobacco abuse, GERD, and kidney stones who has been coughing green and brown phlegm for 2 weeks. She received a chest x-ray at her PCPs office and was found to have left middle lobe pneumonia 1 week ago. She was placed on Levaquin 750 mg daily and has completed a 9 day course. Although she is no longer bringing up phlegm, her cough is very severe. She was admitted for failure of outpt treatment.    Subjective: Continues to have a severe cough and intermittent wheeze. No sore throat, chest pain or fevers.   Assessment/Plan: Principal Problem:   Pneumonia- severe cough - repeat CXR reveals resolution of the pneumonia - I am suspecting she has a residual inflammatory cough and have added Pulmicort nebs, Tussionex and Tessalon Perles to help alleviate the cough- will d/c antibiotics as there is not sign of ongoing infection - she can have duo-nebs as needed for  Wheezing - she has also been started on a PPI on admission in case GERD is causing ongoing cough - need to consider this may be whooping cough- this would require supportive care as we are doing at this time  Active Problems:  Hypertension Continue lisinopril     Code Status: full code Family Communication:  Disposition Plan: home when stable- hopefully tomorrow if symptoms improve DVT prophylaxis: Lovenox  Consultants:   Procedures:   Antibiotics: Anti-infectives    Start     Dose/Rate Route Frequency Ordered Stop   02/16/14 1800  cefTRIAXone (ROCEPHIN) 1 g in dextrose 5 % 50 mL IVPB - Premix  Status:  Discontinued     1 g 100 mL/hr over 30 Minutes Intravenous Every 24 hours 02/16/14 1755 02/17/14 0823   02/16/14 1800  azithromycin (ZITHROMAX) 500 mg in dextrose 5 % 250 mL IVPB  Status:  Discontinued     500 mg 250 mL/hr over 60  Minutes Intravenous Every 24 hours 02/16/14 1755 02/17/14 0823         Objective: Filed Weights   02/17/14 0835  Weight: 129.275 kg (285 lb)    Intake/Output Summary (Last 24 hours) at 02/17/14 1222 Last data filed at 02/17/14 0626  Gross per 24 hour  Intake 1423.75 ml  Output    500 ml  Net 923.75 ml     Vitals Filed Vitals:   02/16/14 2257 02/17/14 0605 02/17/14 0835 02/17/14 0923  BP: 146/86 122/64    Pulse: 93 91  92  Temp: 98.4 F (36.9 C) 98.6 F (37 C)    TempSrc: Oral Oral    Resp: 16 16  16   Height:   5\' 3"  (1.6 m)   Weight:   129.275 kg (285 lb)   SpO2: 96% 92%  93%    Exam: General: AAO x3, No acute respiratory distress Lungs: Clear to auscultation bilaterally without wheezes or crackles- has a severe cough which even limits her talking Cardiovascular: Regular rate and rhythm without murmur gallop or rub normal S1 and S2 Abdomen: Nontender, nondistended, soft, bowel sounds positive, no rebound, no ascites, no appreciable mass Extremities: No significant cyanosis, clubbing, or edema bilateral lower extremities  Data Reviewed: Basic Metabolic Panel:  Recent Labs Lab 02/16/14 2038 02/17/14 0223  NA 139 138  K 6.3* 3.6  CL 105 105  CO2 28 29  GLUCOSE 98 91  BUN 8 <  5*  CREATININE 0.82 0.75  CALCIUM 9.1 8.4   Liver Function Tests: No results for input(s): AST, ALT, ALKPHOS, BILITOT, PROT, ALBUMIN in the last 168 hours. No results for input(s): LIPASE, AMYLASE in the last 168 hours. No results for input(s): AMMONIA in the last 168 hours. CBC:  Recent Labs Lab 02/16/14 2213  WBC 10.3  HGB 13.8  HCT 41.5  MCV 88.5  PLT 298   Cardiac Enzymes: No results for input(s): CKTOTAL, CKMB, CKMBINDEX, TROPONINI in the last 168 hours. BNP (last 3 results) No results for input(s): BNP in the last 8760 hours.  ProBNP (last 3 results) No results for input(s): PROBNP in the last 8760 hours.  CBG: No results for input(s): GLUCAP in the last 168  hours.  Recent Results (from the past 240 hour(s))  Culture, sputum-assessment     Status: None   Collection Time: 02/17/14  6:54 AM  Result Value Ref Range Status   Specimen Description SPUTUM  Final   Special Requests Immunocompromised  Final   Sputum evaluation   Final    MICROSCOPIC FINDINGS SUGGEST THAT THIS SPECIMEN IS NOT REPRESENTATIVE OF LOWER RESPIRATORY SECRETIONS. PLEASE RECOLLECT. Notified Reyes Ivan RN [id number] 02/17/14 Leonard,A    Report Status 02/17/2014 FINAL  Final     Studies:  Recent x-ray studies have been reviewed in detail by the Attending Physician  Scheduled Meds:  Scheduled Meds: . allopurinol  100 mg Oral Daily  . benzonatate  100 mg Oral TID  . budesonide (PULMICORT) nebulizer solution  0.25 mg Nebulization BID  . chlorpheniramine-HYDROcodone  5 mL Oral Q12H  . enoxaparin (LOVENOX) injection  40 mg Subcutaneous Q24H  . escitalopram  10 mg Oral Daily  . guaiFENesin  600 mg Oral BID  . lisinopril  10 mg Oral Daily  . pantoprazole  20 mg Oral Daily  . vitamin C  500 mg Oral Daily   Continuous Infusions: . sodium chloride 75 mL/hr at 02/17/14 0845    Time spent on care of this patient: 35 min   Jrue Jarriel, MD 02/17/2014, 12:22 PM  LOS: 1 day   [facility] Office  [phone] Pager - Text Page per [email or web address]  If 7PM-7AM, please contact night-coverage [email or web address]

## 2014-02-17 NOTE — Progress Notes (Signed)
UR completed.  Arek Spadafore, RN BSN [facility] CCM Trauma/Neuro ICU Case Manager [phone]  

## 2014-02-17 NOTE — Progress Notes (Signed)
Lab result 0223 after blood redraw, Potassium=3.6

## 2014-02-18 DIAGNOSIS — J45901 Unspecified asthma with (acute) exacerbation: Secondary | ICD-10-CM

## 2014-02-18 DIAGNOSIS — F419 Anxiety disorder, unspecified: Secondary | ICD-10-CM

## 2014-02-18 DIAGNOSIS — J4521 Mild intermittent asthma with (acute) exacerbation: Secondary | ICD-10-CM

## 2014-02-18 LAB — BASIC METABOLIC PANEL
Anion gap: 5 (ref 5–15)
BUN: <5 mg/dL — ABNORMAL LOW (ref 6–23)
CHLORIDE: 106 mmol/L (ref 96–112)
CO2: 29 mmol/L (ref 19–32)
Calcium: 8.5 mg/dL (ref 8.4–10.5)
Creatinine, Ser: 0.71 mg/dL (ref 0.50–1.10)
Glucose, Bld: 124 mg/dL — ABNORMAL HIGH (ref 70–99)
POTASSIUM: 4 mmol/L (ref 3.5–5.1)
SODIUM: 140 mmol/L (ref 135–145)

## 2014-02-18 LAB — CBC
HCT: 42.9 % (ref 36.0–46.0)
HEMOGLOBIN: 14 g/dL (ref 12.0–15.0)
MCH: 29.5 pg (ref 26.0–34.0)
MCHC: 32.6 g/dL (ref 30.0–36.0)
MCV: 90.3 fL (ref 78.0–100.0)
Platelets: 322 10*3/uL (ref 150–400)
RBC: 4.75 MIL/uL (ref 3.87–5.11)
RDW: 13.6 % (ref 11.5–15.5)
WBC: 9.9 10*3/uL (ref 4.0–10.5)

## 2014-02-18 LAB — URINE CULTURE
Colony Count: NO GROWTH
Culture: NO GROWTH

## 2014-02-18 LAB — EXPECTORATED SPUTUM ASSESSMENT W REFEX TO RESP CULTURE: SPECIAL REQUESTS: NORMAL

## 2014-02-18 LAB — HIV ANTIBODY (ROUTINE TESTING W REFLEX): HIV SCREEN 4TH GENERATION: NONREACTIVE

## 2014-02-18 LAB — EXPECTORATED SPUTUM ASSESSMENT W GRAM STAIN, RFLX TO RESP C

## 2014-02-18 MED ORDER — BECLOMETHASONE DIPROPIONATE 80 MCG/ACT IN AERS: 1.0000 | INHALATION_SPRAY | Freq: Two times a day (BID) | RESPIRATORY_TRACT | Status: DC

## 2014-02-18 MED ORDER — HYDROCOD POLST-CHLORPHEN POLST 10-8 MG/5ML PO LQCR: 5.0000 mL | Freq: Two times a day (BID) | ORAL | Status: DC

## 2014-02-18 NOTE — Discharge Summary (Signed)
Physician Discharge Summary  Katrina Murphy ZOX:[id number] DOB: 09-28-1975 DOA: 02/16/2014  PCP: Cain Saupe, MD  Admit date: 02/16/2014 Discharge date: 02/18/2014  Recommendations for Outpatient Follow-up:  1. Pt will need to follow up with PCP in 2 weeks post discharge   Discharge Diagnoses:  Pneumonia/post-infectious hyperreactive airways/bronchospasm-reactive airway disease -02/08/2014 chest x-ray showed left middle lobe infiltrate -Patient was treated with 1 week levofloxacin prior to admission -based upon clinical hx and epidemiologic hx, low clinical suspicion for pertussis--she is clinically improving with conservative therapy -She continued to have dyspnea and persistent coughing with yellow sputum. -Presented back to the emergency department 02/16/2014 -Initially started on ceftriaxone and azithromycin -Antibiotic were discontinued and the patient was observed off of antimicrobial therapy -Repeat chest x-ray was negative for any infiltrates -The patient remained afebrile and hemodynamically stable without any tachycardia or respiratory distress -The patient was treated symptomatically with antitussives and pulmicort -She clinically improved with improving dyspnea and improving cough -The patient will be discharged with Tussionex and Qvar -The patient did not have any respiratory distress or hypoxemia -Walking/ambulatory pulse oximetry did not reveal any oxygen desaturation Hypertension -Controlled -Continue lisinopril -pt can restart her HCTZ after d/c Depression  -continue Lexapro  Discharge Condition: stable  Disposition: home  Diet:regular Wt Readings from Last 3 Encounters:  02/17/14 129.275 kg (285 lb)  12/19/12 127.733 kg (281 lb 9.6 oz)    History of present illness:  39 y.o. female, with a PMH of tobacco abuse, GERD, and kidney stones who has been coughing green and brown phlegm for 2 weeks. She received a chest x-ray at her PCPs office and was found  to have left middle lobe pneumonia 1 week ago on 02/08/14. She was placed on Levaquin 750 mg daily for one week with which she was compliant. On 02/16/14, she returned her primary care physician's office. Her symptoms were the same or worse. She was found to have rhonchorous breath sounds, still coughing up dark sputum now streaked with blood. As a result, the patient was admitted to the hospital for intravenous antibiotics. Chest x-ray during this admission was negative for any infiltrates. The patient remained afebrile and hemodynamically stable without any respiratory distress. Her antibiotics were discontinued, and the patient was observed off antibiotics x 48 hours. She remained clinically stable. In fact with conservative therapy including antitussives and Pulmicort the patient slowly improved. She'll be discharged home with Qvar and Tussionex   Discharge Exam: Filed Vitals:   02/18/14 0948  BP: 132/65  Pulse: 75  Temp: 97.8 F (36.6 C)  Resp: 18   Filed Vitals:   02/17/14 2153 02/18/14 0613 02/18/14 0743 02/18/14 0948  BP:  105/58  132/65  Pulse: 88 88 89 75  Temp:  98.8 F (37.1 C)  97.8 F (36.6 C)  TempSrc:  Oral  Oral  Resp: Height:      Weight:      SpO2:  93% 92% 94%   General: A&O x 3, NAD, pleasant, cooperative Cardiovascular: RRR, no rub, no gallop, no S3 Respiratory: bilateral scattered rales without any wheezing.  Abdomen:soft, nontender, nondistended, positive bowel sounds Extremities: No edema, No lymphangitis, no petechiae  Discharge Instructions  Discharge Instructions    Diet - low sodium heart healthy    Complete by:  As directed      Increase activity slowly    Complete by:  As directed             Medication List  STOP taking these medications        cephALEXin 250 MG capsule  Commonly known as:  KEFLEX     cephALEXin 500 MG capsule  Commonly known as:  KEFLEX     levofloxacin 750 MG tablet  Commonly known as:  LEVAQUIN       TAKE these medications        albuterol 108 (90 BASE) MCG/ACT inhaler  Commonly known as:  PROVENTIL HFA;VENTOLIN HFA  Inhale 2 puffs into the lungs every 4 (four) hours as needed for wheezing or shortness of breath.     allopurinol 100 MG tablet  Commonly known as:  ZYLOPRIM  Take 100 mg by mouth daily. Patient takes in am     beclomethasone 80 MCG/ACT inhaler  Commonly known as:  QVAR  Inhale 1 puff into the lungs 2 (two) times daily.     benzonatate 100 MG capsule  Commonly known as:  TESSALON  Take 100 mg by mouth 3 (three) times daily as needed for cough.     chlorpheniramine-HYDROcodone 10-8 MG/5ML Lqcr  Commonly known as:  TUSSIONEX  Take 5 mLs by mouth every 12 (twelve) hours.     escitalopram 10 MG tablet  Commonly known as:  LEXAPRO  Take 10 mg by mouth daily.     hydrochlorothiazide 25 MG tablet  Commonly known as:  HYDRODIURIL  Take 25 mg by mouth daily. Patient takes in am     HYDROcodone-acetaminophen 5-325 MG per tablet  Commonly known as:  NORCO/VICODIN  Take 1 tablet by mouth every 6 (six) hours as needed for moderate pain (back trouble).     ibuprofen 200 MG tablet  Commonly known as:  ADVIL,MOTRIN  Take 200 mg by mouth every 6 (six) hours as needed.     lansoprazole 15 MG capsule  Commonly known as:  PREVACID  Take 15 mg by mouth daily at 12 noon.     lisinopril 10 MG tablet  Commonly known as:  PRINIVIL,ZESTRIL  Take 10 mg by mouth daily. Patient takes in am     multivitamin tablet  Take 1 tablet by mouth daily.     ondansetron 4 MG tablet  Commonly known as:  ZOFRAN  Take 4 mg by mouth every 8 (eight) hours as needed for nausea or vomiting.     ORTHO TRI-CYCLEN (28) PO  Take 1 tablet by mouth daily.     PRESCRIPTION MEDICATION  Apply 1 application topically 2 (two) times daily as needed (for acne outbreak).     valACYclovir 1000 MG tablet  Commonly known as:  VALTREX  Take 1,000 mg by mouth 2 (two) times daily. Take 1000 mg twice  daily for 7 days for cold sore.     vitamin C 500 MG tablet  Commonly known as:  ASCORBIC ACID  Take 500 mg by mouth daily.         The results of significant diagnostics from this hospitalization (including imaging, microbiology, ancillary and laboratory) are listed below for reference.    Significant Diagnostic Studies: Dg Chest 2 View  02/16/2014   CLINICAL DATA:  Cough, congestion.  EXAM: CHEST  2 VIEW  COMPARISON:  02/08/2014  FINDINGS: Stable cardiac and mediastinal contours. No consolidative pulmonary opacities. No pleural effusion or pneumothorax. Regional skeleton is unremarkable.  IMPRESSION: No acute cardiopulmonary process.   Electronically Signed   By: Annia Belt M.D.   On: 02/16/2014 19:40   Dg Chest 2 View  02/08/2014   CLINICAL DATA:  Cough and congestion .  EXAM: CHEST  2 VIEW  COMPARISON:  None.  FINDINGS: Mediastinum hilar structures are normal. Left mid lung field infiltrate noted consistent pneumonia. Right lung clear. No pleural effusion or pneumothorax. Heart size normal. Pulmonary vascularity normal.  IMPRESSION: Left mid lung field acute infiltrate consistent with pneumonia .   Electronically Signed   By: Maisie Fushomas  Register   On: 02/08/2014 14:23     Microbiology: Recent Results (from the past 240 hour(s))  Culture, blood (routine x 2) Call MD if unable to obtain prior to antibiotics being given     Status: None (Preliminary result)   Collection Time: 02/16/14  8:38 PM  Result Value Ref Range Status   Specimen Description BLOOD RIGHT ARM  Final   Special Requests BOTTLES DRAWN AEROBIC AND ANAEROBIC 5CC EA  Final   Culture   Final           BLOOD CULTURE RECEIVED NO GROWTH TO DATE CULTURE WILL BE HELD FOR 5 DAYS BEFORE ISSUING A FINAL NEGATIVE REPORT Performed at [facility]    Report Status PENDING  Incomplete  Urine culture     Status: None   Collection Time: 02/16/14 10:10 PM  Result Value Ref Range Status   Specimen Description URINE, RANDOM   Final   Special Requests Immunocompromised  Final   Colony Count NO GROWTH Performed at [facility]   Final   Culture NO GROWTH Performed at [facility]   Final   Report Status 02/18/2014 FINAL  Final  Culture, blood (routine x 2) Call MD if unable to obtain prior to antibiotics being given     Status: None (Preliminary result)   Collection Time: 02/16/14 10:13 PM  Result Value Ref Range Status   Specimen Description BLOOD RIGHT ARM  Final   Special Requests BOTTLES DRAWN AEROBIC AND ANAEROBIC 4CC EA  Final   Culture   Final           BLOOD CULTURE RECEIVED NO GROWTH TO DATE CULTURE WILL BE HELD FOR 5 DAYS BEFORE ISSUING A FINAL NEGATIVE REPORT Performed at [facility]    Report Status PENDING  Incomplete  Culture, sputum-assessment     Status: None   Collection Time: 02/17/14  6:54 AM  Result Value Ref Range Status   Specimen Description SPUTUM  Final   Special Requests Immunocompromised  Final   Sputum evaluation   Final    MICROSCOPIC FINDINGS SUGGEST THAT THIS SPECIMEN IS NOT REPRESENTATIVE OF LOWER RESPIRATORY SECRETIONS. PLEASE RECOLLECT. Notified Reyes IvanMoss P RN [id number] 02/17/14 Leonard,A    Report Status 02/17/2014 FINAL  Final  Culture, expectorated sputum-assessment     Status: None   Collection Time: 02/17/14 11:44 PM  Result Value Ref Range Status   Specimen Description SPUTUM  Final   Special Requests Normal  Final   Sputum evaluation   Final    THIS SPECIMEN IS ACCEPTABLE. RESPIRATORY CULTURE REPORT TO FOLLOW.   Report Status 02/18/2014 FINAL  Final     Labs: Basic Metabolic Panel:  Recent Labs Lab 02/16/14 2038 02/17/14 0223 02/18/14 0924  NA 139 138 140  K 6.3* 3.6 4.0  CL 105 105 106  CO2 28 29 29   GLUCOSE 98 91 124*  BUN 8 <5* <5*  CREATININE 0.82 0.75 0.71  CALCIUM 9.1 8.4 8.5   Liver Function Tests: No results for input(s): AST, ALT, ALKPHOS, BILITOT, PROT, ALBUMIN in the last 168 hours. No results for input(s): LIPASE,  AMYLASE in the last 168 hours. No results for input(s): AMMONIA in the last 168 hours. CBC:  Recent Labs Lab 02/16/14 2213 02/18/14 0924  WBC 10.3 9.9  HGB 13.8 14.0  HCT 41.5 42.9  MCV 88.5 90.3  PLT 298 322   Cardiac Enzymes: No results for input(s): CKTOTAL, CKMB, CKMBINDEX, TROPONINI in the last 168 hours. BNP: Invalid input(s): POCBNP CBG: No results for input(s): GLUCAP in the last 168 hours.  Time coordinating discharge:  Greater than 30 minutes  Signed:  Hershall Benkert, DO [facility] Pager: [phone] 02/18/2014, 12:49 PM

## 2014-02-18 NOTE — Progress Notes (Signed)
Dc HOME WITH FAMILY, VERBALLY UNDERSTOOD dc INSTRUCTIONS, NO QUESTIONS ASKED

## 2014-02-20 LAB — CULTURE, RESPIRATORY: Culture: NORMAL

## 2014-02-20 LAB — CULTURE, RESPIRATORY W GRAM STAIN

## 2014-02-23 LAB — CULTURE, BLOOD (ROUTINE X 2)
CULTURE: NO GROWTH
Culture: NO GROWTH

## 2014-08-16 ENCOUNTER — Other Ambulatory Visit (INDEPENDENT_AMBULATORY_CARE_PROVIDER_SITE_OTHER): Payer: Self-pay | Admitting: Otolaryngology

## 2014-08-16 DIAGNOSIS — J0101 Acute recurrent maxillary sinusitis: Secondary | ICD-10-CM

## 2014-08-20 ENCOUNTER — Ambulatory Visit
Admission: RE | Admit: 2014-08-20 | Discharge: 2014-08-20 | Disposition: A | Discharge status: Home / Self Care | Payer: 59 | Source: Ambulatory Visit | Attending: Otolaryngology | Admitting: Otolaryngology

## 2014-08-20 DIAGNOSIS — J0101 Acute recurrent maxillary sinusitis: Secondary | ICD-10-CM

## 2015-10-12 ENCOUNTER — Ambulatory Visit (INDEPENDENT_AMBULATORY_CARE_PROVIDER_SITE_OTHER): Payer: 59 | Admitting: Podiatry

## 2015-10-12 ENCOUNTER — Ambulatory Visit (INDEPENDENT_AMBULATORY_CARE_PROVIDER_SITE_OTHER): Payer: 59

## 2015-10-12 ENCOUNTER — Encounter: Payer: Self-pay | Admitting: Podiatry

## 2015-10-12 VITALS — BP 144/98 | HR 80 | Resp 18 | Ht 63.0 in | Wt 298.0 lb

## 2015-10-12 DIAGNOSIS — M722 Plantar fascial fibromatosis: Secondary | ICD-10-CM | POA: Diagnosis not present

## 2015-10-12 DIAGNOSIS — M79671 Pain in right foot: Secondary | ICD-10-CM

## 2015-10-12 DIAGNOSIS — M79672 Pain in left foot: Secondary | ICD-10-CM

## 2015-10-12 DIAGNOSIS — M25472 Effusion, left ankle: Secondary | ICD-10-CM | POA: Diagnosis not present

## 2015-10-12 NOTE — Progress Notes (Signed)
   Subjective:    Patient ID: Katrina Murphy, female    DOB: 02-04-1975, [age] y.o.   MRN: [id number]  HPI    Review of Systems  Constitutional: Positive for fatigue.  Respiratory: Positive for cough.   Skin: Positive for rash.  Allergic/Immunologic: Positive for environmental allergies.  Psychiatric/Behavioral: The patient is nervous/anxious.   All other systems reviewed and are negative.      Objective:   Physical Exam        Assessment & Plan:

## 2015-10-13 MED ORDER — BETAMETHASONE SOD PHOS & ACET 6 (3-3) MG/ML IJ SUSP: 3.0000 mg | Freq: Once | INTRAMUSCULAR | Status: AC

## 2015-10-13 NOTE — Patient Instructions (Signed)
Plantar Fasciitis Plantar fasciitis is a painful foot condition that affects the heel. It occurs when the band of tissue that connects the toes to the heel bone (plantar fascia) becomes irritated. This can happen after exercising too much or doing other repetitive activities (overuse injury). The pain from plantar fasciitis can range from mild irritation to severe pain that makes it difficult for you to walk or move. The pain is usually worse in the morning or after you have been sitting or lying down for a while. CAUSES This condition may be caused by:  Standing for long periods of time.  Wearing shoes that do not fit.  Doing high-impact activities, including running, aerobics, and ballet.  Being overweight.  Having an abnormal way of walking (gait).  Having tight calf muscles.  Having high arches in your feet.  Starting a new athletic activity. SYMPTOMS The main symptom of this condition is heel pain. Other symptoms include:  Pain that gets worse after activity or exercise.  Pain that is worse in the morning or after resting.  Pain that goes away after you walk for a few minutes. DIAGNOSIS This condition may be diagnosed based on your signs and symptoms. Your health care provider will also do a physical exam to check for:  A tender area on the bottom of your foot.  A high arch in your foot.  Pain when you move your foot.  Difficulty moving your foot. You may also need to have imaging studies to confirm the diagnosis. These can include:  X-rays.  Ultrasound.  MRI. TREATMENT  Treatment for plantar fasciitis depends on the severity of the condition. Your treatment may include:  Rest, ice, and over-the-counter pain medicines to manage your pain.  Exercises to stretch your calves and your plantar fascia.  A splint that holds your foot in a stretched, upward position while you sleep (night splint).  Physical therapy to relieve symptoms and prevent problems in the  future.  Cortisone injections to relieve severe pain.  Extracorporeal shock wave therapy (ESWT) to stimulate damaged plantar fascia with electrical impulses. It is often used as a last resort before surgery.  Surgery, if other treatments have not worked after 12 months. HOME CARE INSTRUCTIONS  Take medicines only as directed by your health care provider.  Avoid activities that cause pain.  Roll the bottom of your foot over a bag of ice or a bottle of cold water. Do this for 20 minutes, 3-4 times a day.  Perform simple stretches as directed by your health care provider.  Try wearing athletic shoes with air-sole or gel-sole cushions or soft shoe inserts.  Wear a night splint while sleeping, if directed by your health care provider.  Keep all follow-up appointments with your health care provider. PREVENTION   Do not perform exercises or activities that cause heel pain.  Consider finding low-impact activities if you continue to have problems.  Lose weight if you need to. The best way to prevent plantar fasciitis is to avoid the activities that aggravate your plantar fascia. SEEK MEDICAL CARE IF:  Your symptoms do not go away after treatment with home care measures.  Your pain gets worse.  Your pain affects your ability to move or do your daily activities.   This information is not intended to replace advice given to you by your health care provider. Make sure you discuss any questions you have with your health care provider.   Document Released: 09/12/2000 Document Revised: 09/08/2014 Document Reviewed: 10/28/2013 Elsevier   Interactive Patient Education 2016 [employer]  

## 2015-10-13 NOTE — Progress Notes (Signed)
Patient ID: Katrina Murphy, female   DOB: Nov 26, 1975, 40 y.o.   MRN: [id number] Subjective: Patient presents today for pain and tenderness in the right foot. Patient states the foot pain has been hurting for several weeks now. Patient states that it hurts in the mornings with the first steps out of bed. Patient presents today for further treatment and evaluation  Objective: Physical Exam General: The patient is alert and oriented x3 in no acute distress.  Dermatology: Skin is warm, dry and supple bilateral lower extremities. Negative for open lesions or macerations bilateral.   Vascular: Dorsalis Pedis and Posterior Tibial pulses palpable bilateral.  Capillary fill time is immediate to all digits.  Neurological: Epicritic and protective threshold intact bilateral.   Musculoskeletal: Tenderness to palpation at the lateral band of the plantar fascia and through the insertion of the plantar fascia of the right foot. All other joints range of motion within normal limits bilateral. Strength 5/5 in all groups bilateral.  Edema and tenderness noted to the anterior lateral medial aspects of the left ankle  Radiographic exam: Normal osseous mineralization. Joint spaces preserved. No fracture/dislocation/boney destruction. Calcaneal spur present with mild thickening of plantar fascia right. No other soft tissue abnormalities or radiopaque foreign bodies.   Assessment: 1. Plantar fasciitis right foot lateral band 2. Pain in right foot lateral band 3. Edema left ankle  Plan of Care:  1. Patient evaluated. Xrays reviewed.   2. Injection of 0.5cc Celestone soluspan injected into the right heel at the insertion of the plantar fascia lateral band.  3. Instructed patient regarding therapies and modalities at home to alleviate symptoms.  4.Rx for anti-inflammatory pain cream dispensed through [facility]. 6. Compression ankle sleeve dispensed today for left ankle edema 7. Return to clinic in 4  weeks.

## 2015-10-24 ENCOUNTER — Telehealth: Payer: Self-pay | Admitting: *Deleted

## 2015-10-24 MED ORDER — NONFORMULARY OR COMPOUNDED ITEM: 2 refills | Status: DC

## 2015-10-24 NOTE — Telephone Encounter (Signed)
Pt states she was to get a mail order cream, but no one has called.  I reviewed 10/12/2015 notes and pt was to get [facility] antiinflammatory cream. Informed pt I would order as [place] and pt asked about a urea 40 cream. I told her about Revitaderm40 and she states she would like to be put on the waiting list for it. L. Atwood to pt's name for list to call when Revitaderm40 arrives.

## 2017-08-19 IMAGING — CT CT MAXILLOFACIAL W/O CM
3 series · 15 of 47 positions shown, 18 images · non-contrast
Comparison: Head CT 03/25/2007

CLINICAL DATA: Acute recurrent maxillary sinusitis. Right facial
pain and swelling. Symptoms present since 02/17/2014.

EXAM:
CT MAXILLOFACIAL WITHOUT CONTRAST
TECHNIQUE: Multidetector CT imaging of the maxillofacial structures was
performed. Multiplanar CT image reconstructions were also generated.
A small metallic BB was placed on the right temple in order to
reliably differentiate right from left.

[Series 4: soft tissue · axial · 0.46mm/px · z∈[-165,-62]mm · 9 of 121 slices shown, 12 images]
[im 9/121  brain]
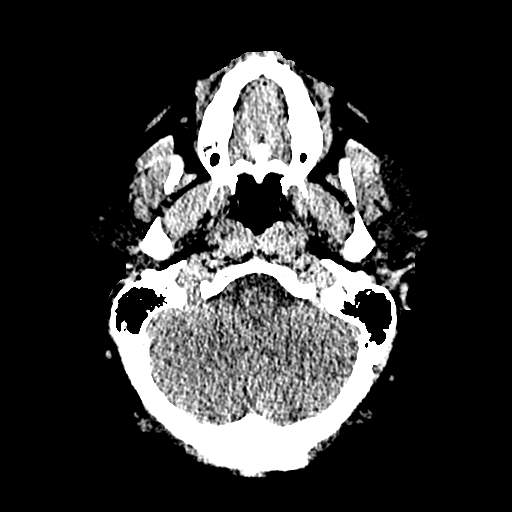
[im 9/121  bone]
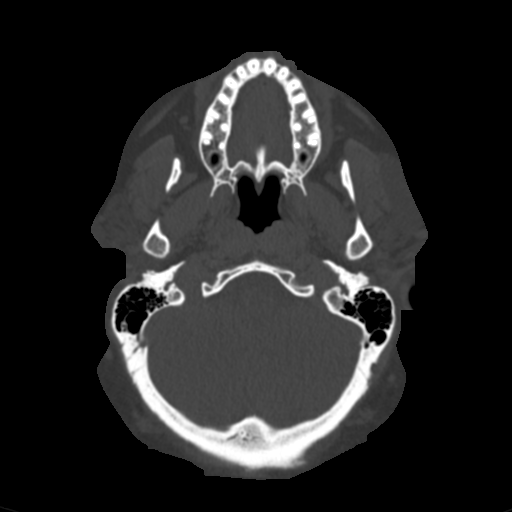
[im 21/121  bone]
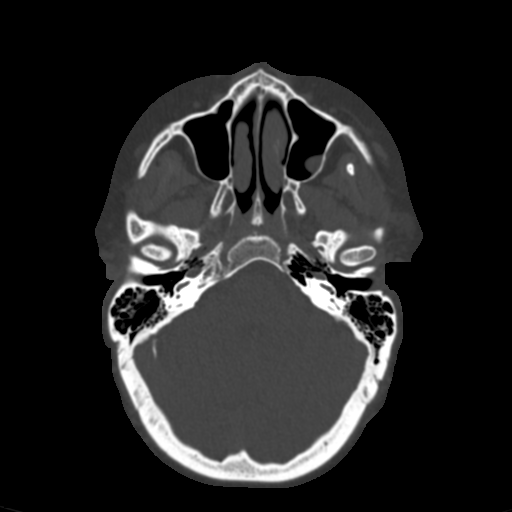
[im 34/121  bone]
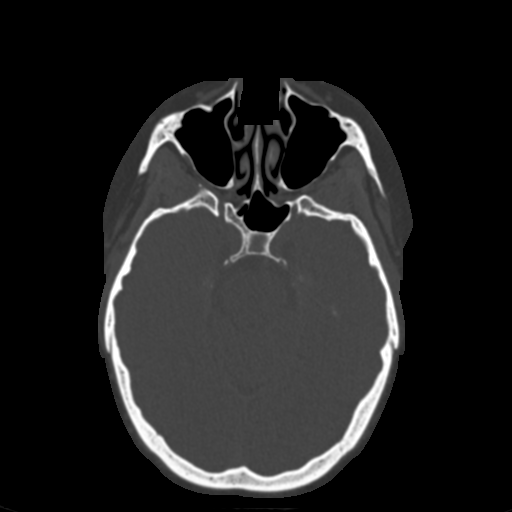
[im 46/121  bone]
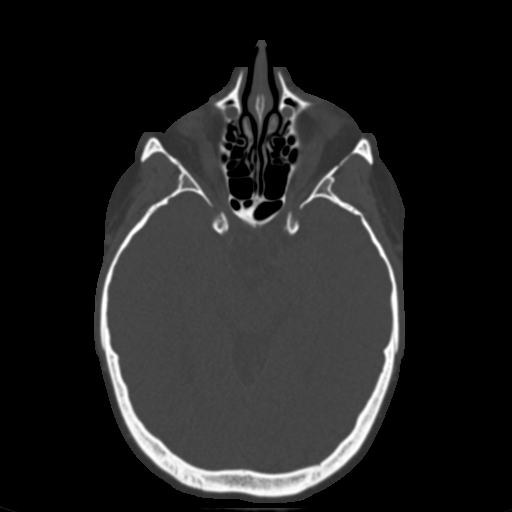
[im 63/121  brain]
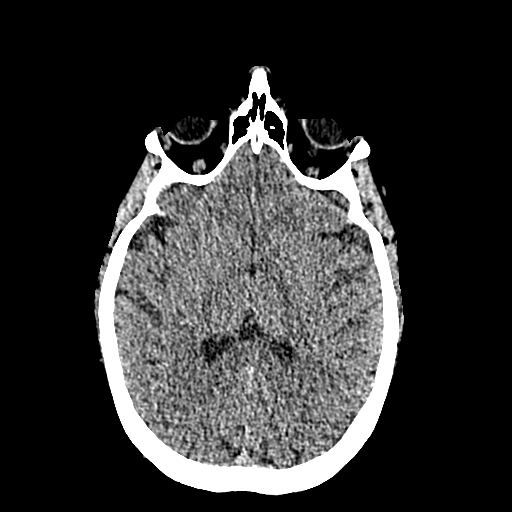
[im 63/121  bone]
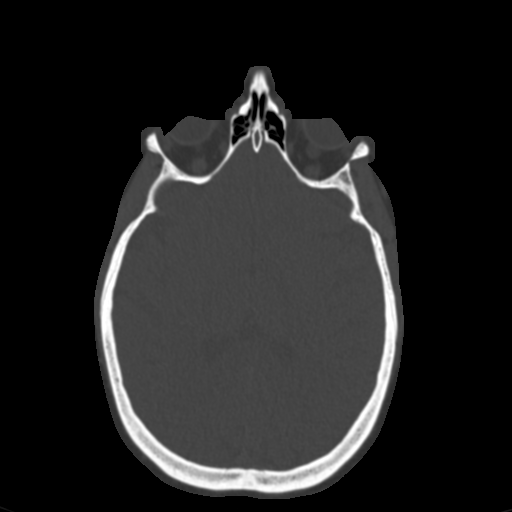
[im 75/121  bone]
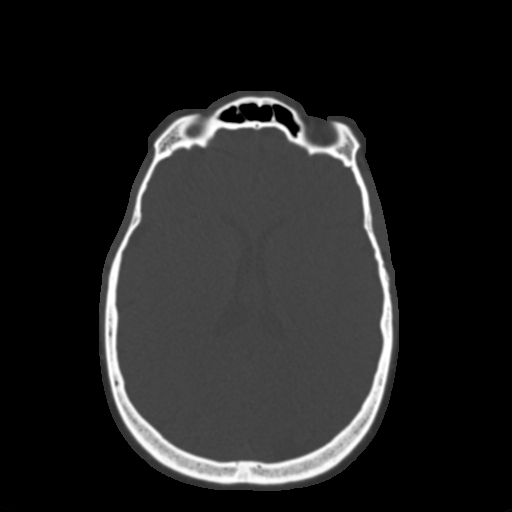
[im 87/121  bone]
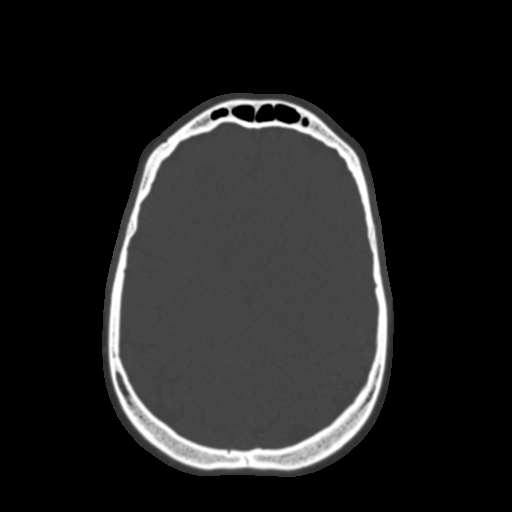
[im 100/121  bone]
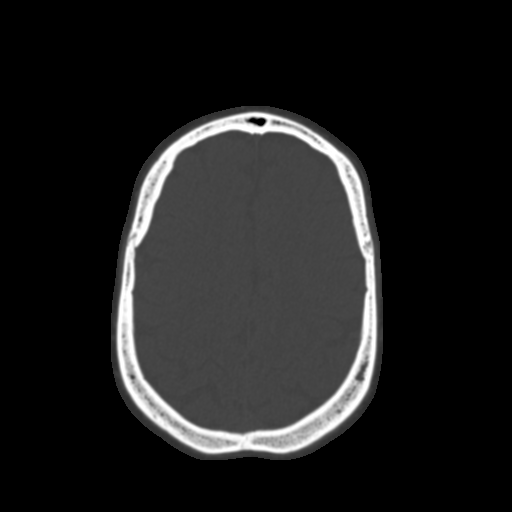
[im 112/121  brain]
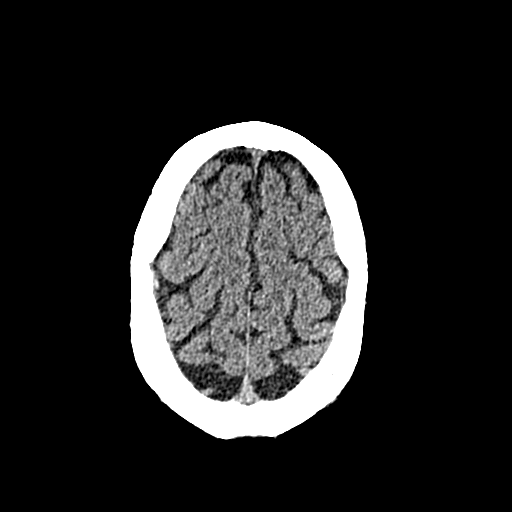
[im 112/121  bone]
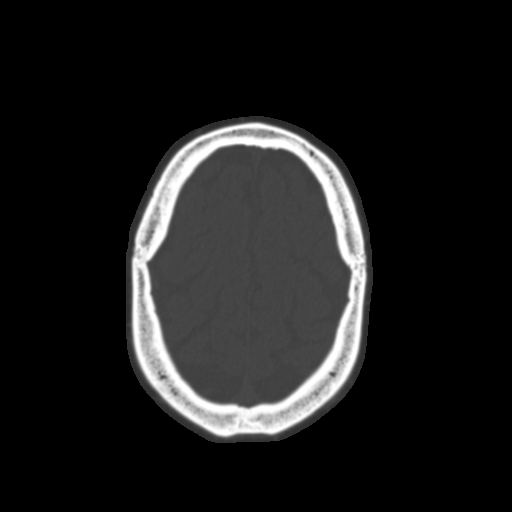

[Series 5: cor bone · coronal · 0.30mm/px · 3 of 102 slices shown]
[im 34/102  bone]
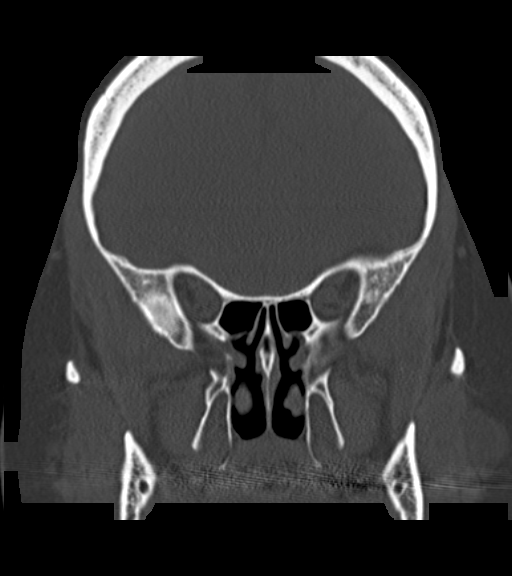
[im 45/102  bone]
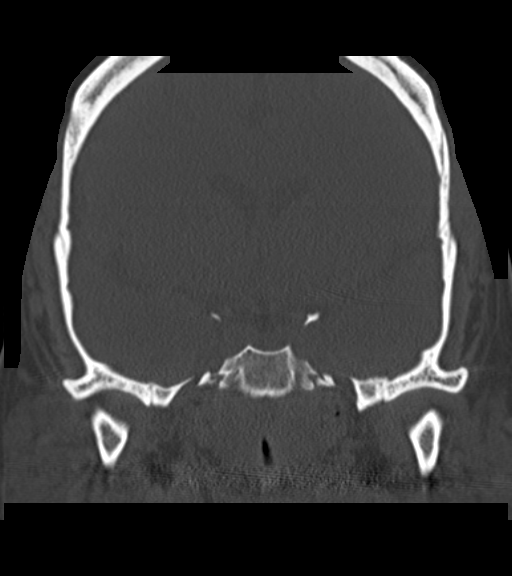
[im 57/102  bone]
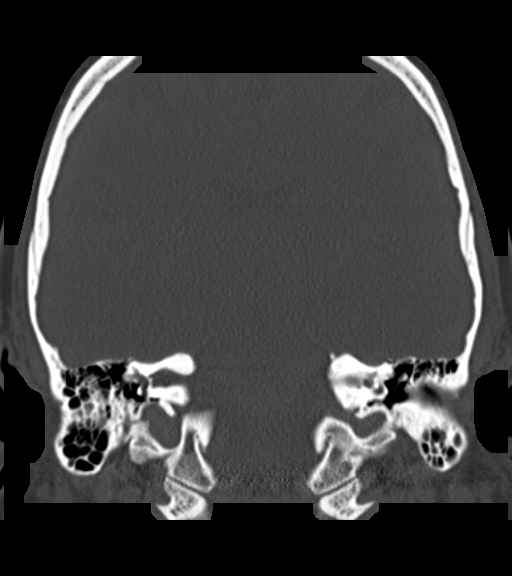

[Series 6: sag bone · sagittal · 0.33mm/px · 3 of 84 slices shown]
[im 28/84  bone]
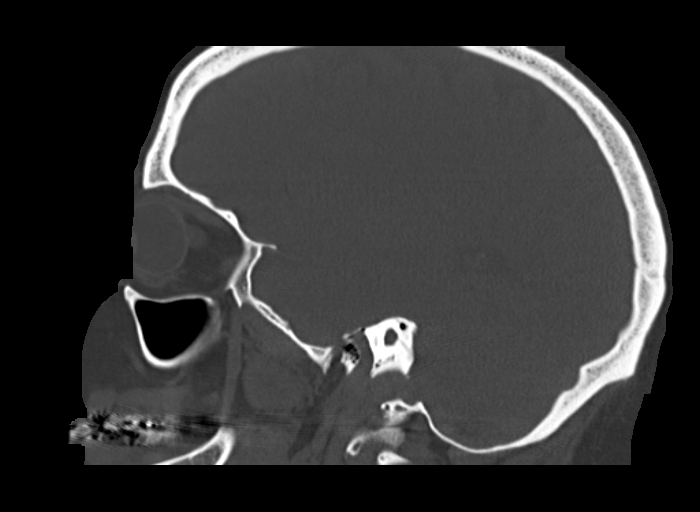
[im 42/84  bone]
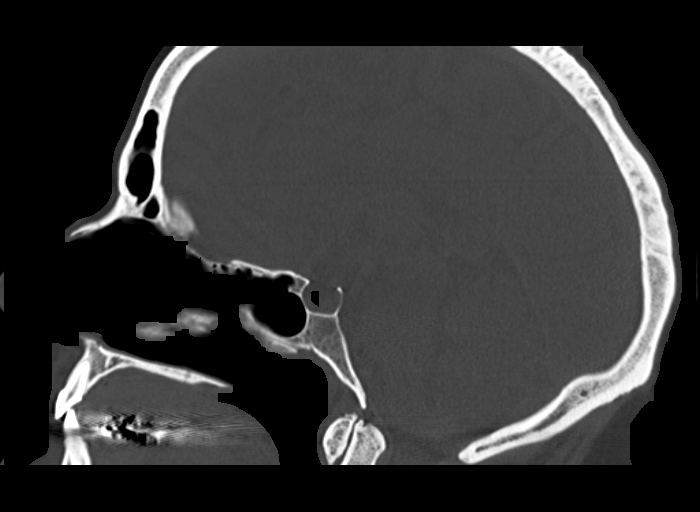
[im 56/84  bone]
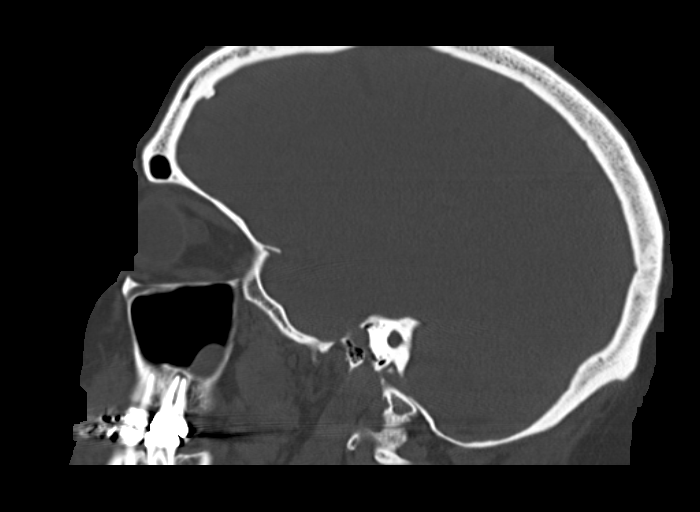

[15 of 47 positions shown; findings below may reference images not displayed]

FINDINGS: Small mucous retention cysts are present inferiorly in both
maxillary sinuses, new from the prior head CT. Minimal mucosal
thickening superiorly in the maxillary sinuses results in mild
ostial narrowing on the left and more pronounced ostial narrowing or
occlusion on the right. The infundibulum on the right is narrowed by
a small Haller cell.

The other paranasal sinuses are clear. No fluid levels are present.
There is rightward nasal septal deviation and spurring. The
visualized mastoid air cells are clear. The orbits and visualized
portion of the brain are unremarkable.
IMPRESSION: 1. Small bilateral maxillary sinus mucous retention cyst. Otherwise
clear sinuses.
2. Rightward nasal septal deviation and spurring.

## 2018-12-09 ENCOUNTER — Ambulatory Visit (INDEPENDENT_AMBULATORY_CARE_PROVIDER_SITE_OTHER): Payer: 59 | Admitting: Pulmonary Disease

## 2018-12-09 ENCOUNTER — Other Ambulatory Visit: Payer: Self-pay

## 2018-12-09 ENCOUNTER — Encounter: Payer: Self-pay | Admitting: Pulmonary Disease

## 2018-12-09 DIAGNOSIS — G4733 Obstructive sleep apnea (adult) (pediatric): Secondary | ICD-10-CM | POA: Diagnosis not present

## 2018-12-09 NOTE — Patient Instructions (Signed)
Schedule home sleep test. We discussed CPAP therapy

## 2018-12-09 NOTE — Progress Notes (Signed)
Subjective:    Patient ID: Katrina Murphy, female    DOB: 05/26/1975, 43 y.o.   MRN: [id number]  HPI  Chief Complaint  Patient presents with  . Consult    Sleep issues, not sure if snoring or sleep apnea. Cannot sleep on her back.   43 year old woman presents for evaluation of excessive daytime somnolence and tiredness. She reports nonrefreshing sleep for at least the last 10 years, cannot make it through the day without napping. Epworth sleepiness score is 22 and she reports sleepiness in various social situations. 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  She reports difficulty concentrating or multitasking occasionally has problems driving especially when she stopped at a light. She has always been a night owl, bedtime now is between 11 PM to 1 AM, sleep latency is less than 20 minutes, she sleeps on her side with 2 pillows cannot lie on her back, reports numerous spontaneous nocturnal awakenings including nocturia and is out of bed by 8 AM feeling tired with dryness of mouth and headaches.  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  She reports 2-3 naps during the day sometimes as long as 2 to 4 hours.  Naps are not refreshing  There is no history suggestive of cataplexy, sleep paralysis or parasomnias she denies excessive use of caffeinated beverages Lives alone and no bed partner history is available  Dad has OSA     Past Medical History:  Diagnosis Date  . Anxiety   . Bacterial pneumonia 02/08/2014  . Chronic bronchitis ([place])    "got it q yr when I smoked" (02/16/2014)  . Family history of anesthesia complication    father- severe vomiting   . GERD (gastroesophageal reflux disease)   . Hypertension   . Kidney stones   . Pneumonia 1980's X 1; 2000   Past Surgical History:  Procedure Laterality Date  . BACK SURGERY    . CARPAL TUNNEL RELEASE Bilateral 2008  . CYSTOSCOPY WITH RETROGRADE  PYELOGRAM, URETEROSCOPY AND STENT PLACEMENT Left 12/19/2012   Procedure: CYSTOSCOPY WITH RETROGRADE PYELOGRAM, URETEROSCOPY AND STENT PLACEMENT;  Surgeon: Alexis Frock, MD;  Location: [facility];  Service: Urology;  Laterality: Left;  . CYSTOSCOPY WITH STENT PLACEMENT  ~ 2004; ~ 2006; ~ 2010   "took all the stents out myself" (02/16/2014)  . HOLMIUM LASER APPLICATION Left 78/29/5621   Procedure: HOLMIUM LASER APPLICATION;  Surgeon: Alexis Frock, MD;  Location: [facility];  Service: Urology;  Laterality: Left;  . [place]   "removed herniated pieces L4-5"  . TONSILLECTOMY  2002   "one has grown back"  . WISDOM TOOTH EXTRACTION      Allergies  Allergen Reactions  . Augmentin [Amoxicillin-Pot Clavulanate] Hives, Itching and Nausea And Vomiting  . Sulfa Antibiotics Anaphylaxis  . Amoxicillin Nausea And Vomiting  . Doxycycline Hives  . Sucralose Other (See Comments)    Pt experience headache, N/V with all artificial sweeteners.   Marland Kitchen Spironolactone Other (See Comments)    Hypotension, incoherent     Social History   Socioeconomic History  . Marital status: Single    Spouse name: Not on file  . Number of children: Not on file  . Years of education: Not on file  . Highest education level: Not on file  Occupational History  . Not on file  Social Needs  . Financial resource strain: Not on file  . Food  insecurity    Worry: Not on file    Inability: Not on file  . Transportation needs    Medical: Not on file    Non-medical: Not on file  Tobacco Use  . Smoking status: Former Smoker    Packs/day: 0.50    Years: 15.00    Pack years: 7.50    Types: Cigarettes    Quit date: 01/07/2009    Years since quitting: 9.9  . Smokeless tobacco: Never Used  Substance and Sexual Activity  . Alcohol use: Yes    Comment: 02/16/2014 "might drink 1 drink/month; if that"  . Drug use: No  . Sexual activity: Not Currently  Lifestyle  . Physical activity    Days per week: Not on file     Minutes per session: Not on file  . Stress: Not on file  Relationships  . Social Musician on phone: Not on file    Gets together: Not on file    Attends religious service: Not on file    Active member of club or organization: Not on file    Attends meetings of clubs or organizations: Not on file    Relationship status: Not on file  . Intimate partner violence    Fear of current or ex partner: Not on file    Emotionally abused: Not on file    Physically abused: Not on file    Forced sexual activity: Not on file  Other Topics Concern  . Not on file  Social History Narrative  . Not on file     History reviewed. No pertinent family history. Allergies in both parents, father has heart disease and renal cell cancer, mom had asthma  Review of Systems  Constitutional: negative for anorexia, fevers and sweats  Eyes: negative for irritation, redness and visual disturbance  Ears, nose, mouth, throat, and face: negative for earaches, epistaxis, nasal congestion and sore throat  Respiratory: negative for cough, sputum and wheezing + dyspnea on exertion Cardiovascular: negative for chest pain,  lower extremity edema, orthopnea, palpitations and syncope  Gastrointestinal: negative for abdominal pain, constipation, diarrhea, melena, nausea and vomiting + heartburn Genitourinary:negative for dysuria, frequency and hematuria  Hematologic/lymphatic: negative for bleeding, easy bruising and lymphadenopathy  Musculoskeletal:negative for arthralgias, muscle weakness and stiff joints  Neurological: negative for coordination problems, gait problems, headaches and weakness  Endocrine: negative for diabetic symptoms including polydipsia, polyuria and weight loss +for anxiety and depression     Objective:   Physical Exam  Gen. Pleasant, obese, in no distress, normal affect ENT - no pallor,icterus, no post nasal drip, class 2-3 airway Neck: No JVD, no thyromegaly, no carotid bruits  Lungs: no use of accessory muscles, no dullness to percussion, decreased without rales or rhonchi  Cardiovascular: Rhythm regular, heart sounds  normal, no murmurs or gallops, no peripheral edema Abdomen: soft and non-tender, no hepatosplenomegaly, BS normal. Musculoskeletal: No deformities, no cyanosis or clubbing Neuro:  alert, non focal, no tremors       Assessment & Plan:

## 2018-12-10 DIAGNOSIS — G4733 Obstructive sleep apnea (adult) (pediatric): Secondary | ICD-10-CM | POA: Insufficient documentation

## 2018-12-10 NOTE — Assessment & Plan Note (Signed)
Given excessive daytime somnolence, narrow pharyngeal exam, witnessed apneas & loud snoring, obstructive sleep apnea is very likely & an overnight polysomnogram will be scheduled as a home study. The pathophysiology of obstructive sleep apnea , it's cardiovascular consequences & modes of treatment including CPAP were discused with the patient in detail & they evidenced understanding.  Pretest probability is high for OSA, she is willing to use CPAP if needed.  If she does not improve as much with CPAP then can consider investigating for other sleep disorders if hypersomnolence persists

## 2019-04-09 ENCOUNTER — Telehealth: Payer: Self-pay | Admitting: Pulmonary Disease

## 2019-04-09 DIAGNOSIS — G4733 Obstructive sleep apnea (adult) (pediatric): Secondary | ICD-10-CM

## 2019-04-09 NOTE — Telephone Encounter (Signed)
There is not an order in here for HST. Will place order after reviewing Dr. Vassie Loll notes. Can Katrina Murphy be placed on the top of the list since this was our error?

## 2019-04-10 NOTE — Telephone Encounter (Signed)
Katrina Murphy working on this one.

## 2019-04-10 NOTE — Telephone Encounter (Signed)
Appointment scheduled 04/13/2019 pt aware

## 2019-04-13 ENCOUNTER — Other Ambulatory Visit: Payer: Self-pay

## 2019-04-13 ENCOUNTER — Ambulatory Visit: Payer: 59

## 2019-04-13 DIAGNOSIS — G4733 Obstructive sleep apnea (adult) (pediatric): Secondary | ICD-10-CM

## 2019-04-15 ENCOUNTER — Telehealth: Payer: Self-pay | Admitting: Pulmonary Disease

## 2019-04-15 DIAGNOSIS — G4733 Obstructive sleep apnea (adult) (pediatric): Secondary | ICD-10-CM

## 2019-04-15 NOTE — Telephone Encounter (Signed)
HST >> poor recording x 2h Please repeat study

## 2019-04-15 NOTE — Telephone Encounter (Signed)
Called pt and advised message from the provider. Pt understood and verbalized understanding. Nothing further is needed.   HST will be reordered.

## 2019-04-24 ENCOUNTER — Ambulatory Visit: Payer: 59

## 2019-04-24 ENCOUNTER — Other Ambulatory Visit: Payer: Self-pay

## 2019-04-24 DIAGNOSIS — G4733 Obstructive sleep apnea (adult) (pediatric): Secondary | ICD-10-CM | POA: Diagnosis not present

## 2019-05-05 ENCOUNTER — Telehealth: Payer: Self-pay | Admitting: Pulmonary Disease

## 2019-05-05 DIAGNOSIS — G4733 Obstructive sleep apnea (adult) (pediatric): Secondary | ICD-10-CM | POA: Diagnosis not present

## 2019-05-05 NOTE — Telephone Encounter (Signed)
HST showed mod OSA with AHI 16/ hr Suggest autoCPAP  5-15 cm, mask of choice OV with me/APP in 6 wks

## 2019-05-06 NOTE — Telephone Encounter (Signed)
Order placed to DME for CPAP to go to [facility]  Still needing f/u set up [facility]

## 2019-05-06 NOTE — Telephone Encounter (Signed)
Patient states would like to use [facility] for CPAP machine. Patient phone number is [phone].

## 2019-05-06 NOTE — Telephone Encounter (Signed)
Spoke with pt about sleep study results. Patient agreed to start CPAP therapy. She does agree to use CPAP but would like to check with her insurance first on which DME is in network. I will await her return call to place order.

## 2019-05-15 NOTE — Telephone Encounter (Signed)
Pt states [facility] has not received order for CPAP machine.  [place] 3 [place] 4 mths and wanted to get everythign set up.  Please advise.  [phone]

## 2019-05-15 NOTE — Telephone Encounter (Signed)
[facility] has received this Referral

## 2019-05-15 NOTE — Telephone Encounter (Signed)
I have sent this stat to [facility]

## 2019-05-15 NOTE — Telephone Encounter (Signed)
PCCs, can you please help [place] out with this?

## 2019-05-18 NOTE — Telephone Encounter (Signed)
lmam that we have received confirmation on this

## 2019-06-05 ENCOUNTER — Telehealth: Payer: Self-pay | Admitting: Pulmonary Disease

## 2019-06-05 DIAGNOSIS — G4733 Obstructive sleep apnea (adult) (pediatric): Secondary | ICD-10-CM

## 2019-06-05 NOTE — Telephone Encounter (Signed)
Pt returning a phone call. Pt can be reached at [phone]. Pt is ok with leaving detailed message on what she should do.

## 2019-06-05 NOTE — Telephone Encounter (Signed)
ATC pt, no answer. Left message for pt to call back.  

## 2019-06-08 NOTE — Telephone Encounter (Signed)
Lower to  Auto CPAP 5 to 12 cm Ramp for 20 minutes Office visit with APP with download to discuss

## 2019-06-08 NOTE — Telephone Encounter (Signed)
2-4 weeks

## 2019-06-08 NOTE — Telephone Encounter (Signed)
Pt calling back about her cpap machine giving her migraines. Pt can be reached at [phone]

## 2019-06-08 NOTE — Telephone Encounter (Signed)
Patient just got CPAP on Wednesday and states that the 2 days she used it she woke up with spliting headaches. She thinks the pressure or something may need to be changed because she states that it is just enough air to open her mouth and she does not sleep with her mouth open.   Dr. Vassie Loll please advise with any recommendations

## 2019-06-08 NOTE — Telephone Encounter (Signed)
Called and spoke with pt letting her know that we were going to change settings on cpap and she verbalized understanding.  Dr. Vassie Loll, please advise timing for pt to have appt with APP

## 2019-06-09 NOTE — Telephone Encounter (Signed)
Patient called, message left to call our office and make an appointment to see a nurse practioner in 2-4 weeks per Dr. Reginia Naas request. Nothing further needed at this time.

## 2019-07-16 ENCOUNTER — Ambulatory Visit (INDEPENDENT_AMBULATORY_CARE_PROVIDER_SITE_OTHER): Payer: 59 | Admitting: Adult Health

## 2019-07-16 ENCOUNTER — Encounter: Payer: Self-pay | Admitting: Adult Health

## 2019-07-16 ENCOUNTER — Other Ambulatory Visit: Payer: Self-pay

## 2019-07-16 DIAGNOSIS — G4733 Obstructive sleep apnea (adult) (pediatric): Secondary | ICD-10-CM

## 2019-07-16 NOTE — Progress Notes (Signed)
Virtual Visit via Telephone Note  I connected with Katrina Murphy on 07/16/19 at  2:00 PM EDT by telephone and verified that I am speaking with the correct person using two identifiers.  Location: Patient: Home  Provider: Office    I discussed the limitations, risks, security and privacy concerns of performing an evaluation and management service by telephone and the availability of in person appointments. I also discussed with the patient that there may be a patient responsible charge related to this service. The patient expressed understanding and agreed to proceed.   History of Present Illness: 44 year old female seen for sleep consult December 2020 found to have moderate obstructive sleep apnea Medical history is significant for hypertension, GERD and kidney stones  Today's televisit is a follow-up visit for sleep apnea.  Patient was seen in December 2020 for daytime sleepiness.  She was set up for a home sleep study that was done in April 2021.  This showed moderate sleep apnea with AHI 16/hour.  Patient was started on auto CPAP.  Patient says she is doing better she feels more rested. Feels that she is sleeping so soundly. Less daytime sleepiness . Headaches have resolved.  She is using her CPAP each night.  Feels that she benefits from CPAP.  Patient's CPAP download shows excellent compliance with daily average usage at 6.5 hours.  Patient is on AutoSet 5 to 12 cm H2O.  AHI 0.7.  Minimal leaks.  Patient Active Problem List   Diagnosis Date Noted  . OSA (obstructive sleep apnea) 12/10/2018  . Exacerbation of reactive airway disease 02/18/2014  . Pneumonia 02/16/2014  . Kidney stones   . Hypertension   . Chronic bronchitis ([facility])   . GERD (gastroesophageal reflux disease)   . Anxiety    Current Outpatient Medications on File Prior to Visit  Medication Sig Dispense Refill  . albuterol (PROVENTIL HFA;VENTOLIN HFA) 108 (90 BASE) MCG/ACT inhaler Inhale 2 puffs into the lungs every 4  (four) hours as needed for wheezing or shortness of breath.    . allopurinol (ZYLOPRIM) 100 MG tablet Take 100 mg by mouth daily. Patient takes in am    . azelastine (OPTIVAR) 0.05 % ophthalmic solution Apply 1 drop to eye daily as needed. Both eyes    . BIOTIN PO Take by mouth daily.    Marland Kitchen buPROPion (WELLBUTRIN XL) 150 MG 24 hr tablet Take 150 mg by mouth every morning.    Marland Kitchen buPROPion (WELLBUTRIN XL) 300 MG 24 hr tablet Take 300 mg by mouth daily.    . Cholecalciferol (VITAMIN D-3 PO) Take by mouth daily.    . cyclobenzaprine (FLEXERIL) 10 MG tablet Take 10 mg by mouth 3 (three) times daily as needed for muscle spasms.    Marland Kitchen EPINEPHrine 0.3 mg/0.3 mL IJ SOAJ injection Inject into the muscle once.    . fexofenadine (ALLEGRA) 180 MG tablet Take 180 mg by mouth daily.    . hydrochlorothiazide (HYDRODIURIL) 25 MG tablet Take 25 mg by mouth daily. Patient takes in am    . lansoprazole (PREVACID) 15 MG capsule Take 15 mg by mouth daily at 12 noon.    Marland Kitchen lisinopril (PRINIVIL,ZESTRIL) 10 MG tablet Take 10 mg by mouth daily. Patient takes in am    . meloxicam (MOBIC) 15 MG tablet Take 15 mg by mouth daily as needed.    . Multiple Vitamin (MULTIVITAMIN) tablet Take 1 tablet by mouth daily.    . Norgestimate-Ethinyl Estradiol Triphasic (TRI-ESTARYLLA) 0.18/0.215/0.25 MG-35 MCG tablet Take 1  tablet by mouth daily.    Marland Kitchen PROAIR RESPICLICK 108 (90 Base) MCG/ACT AEPB Take 1 puff by mouth daily as needed.    . sertraline (ZOLOFT) 50 MG tablet Take 100 mg by mouth daily.      Current Facility-Administered Medications on File Prior to Visit  Medication Dose Route Frequency Provider Last Rate Last Admin  . betamethasone acetate-betamethasone sodium phosphate (CELESTONE) injection 3 mg  3 mg Intramuscular Once Felecia Shelling, DPM          Observations/Objective: Home sleep study April 2021 AHI 16/hour  Assessment and Plan: Moderate obstructive sleep apnea with excellent compliance and control on nocturnal  CPAP  Morbid obesity encouraged on healthy weight loss  Plan  Patient Instructions  Keep up the good work Continue on CPAP at bedtime Work on healthy weight Do not drive if sleepy Follow-up in 4- 6 months with Dr. Vassie Loll and as needed      Follow Up Instructions: Follow-up in 6 months and as needed   I discussed the assessment and treatment plan with the patient. The patient was provided an opportunity to ask questions and all were answered. The patient agreed with the plan and demonstrated an understanding of the instructions.   The patient was advised to call back or seek an in-person evaluation if the symptoms worsen or if the condition fails to improve as anticipated.  I provided 22  minutes of non-face-to-face time during this encounter.   Katrina Oaks, NP

## 2019-07-16 NOTE — Patient Instructions (Addendum)
Keep up the good work Continue on CPAP at bedtime Work on healthy weight Do not drive if sleepy Follow-up in 4- 6 months with Dr. Vassie Loll and as needed

## 2019-09-02 ENCOUNTER — Ambulatory Visit (HOSPITAL_COMMUNITY)
Admission: RE | Admit: 2019-09-02 | Discharge: 2019-09-02 | Disposition: A | Discharge status: Home / Self Care | Payer: 59 | Source: Ambulatory Visit | Attending: Urgent Care | Admitting: Urgent Care

## 2019-09-02 ENCOUNTER — Other Ambulatory Visit: Payer: Self-pay

## 2019-09-02 ENCOUNTER — Encounter (HOSPITAL_COMMUNITY): Payer: Self-pay

## 2019-09-02 ENCOUNTER — Ambulatory Visit (INDEPENDENT_AMBULATORY_CARE_PROVIDER_SITE_OTHER): Payer: 59

## 2019-09-02 VITALS — BP 156/94 | HR 86 | Temp 98.2°F | Resp 18

## 2019-09-02 DIAGNOSIS — W19XXXA Unspecified fall, initial encounter: Secondary | ICD-10-CM

## 2019-09-02 DIAGNOSIS — J3489 Other specified disorders of nose and nasal sinuses: Secondary | ICD-10-CM | POA: Diagnosis not present

## 2019-09-02 DIAGNOSIS — S0992XA Unspecified injury of nose, initial encounter: Secondary | ICD-10-CM | POA: Diagnosis not present

## 2019-09-02 DIAGNOSIS — S0033XA Contusion of nose, initial encounter: Secondary | ICD-10-CM

## 2019-09-02 DIAGNOSIS — R519 Headache, unspecified: Secondary | ICD-10-CM

## 2019-09-02 MED ORDER — NAPROXEN 500 MG PO TABS: 500.0000 mg | ORAL_TABLET | Freq: Two times a day (BID) | ORAL | 0 refills | Status: DC

## 2019-09-02 NOTE — ED Triage Notes (Signed)
Pt presents with nose pain after falling on Monday. States tripped and fell and hit hard wood floors.   States Advil gives little relief.

## 2019-09-02 NOTE — ED Provider Notes (Signed)
[facility]   MRN: [id number] DOB: 07-31-1975  Subjective:   Katrina Murphy is a 44 y.o. female presenting for 2-day history of persistent pain over nose.  Patient accidentally tripped and fell directly onto a hardwood floor, states that she made impact with her face.  Has a history of nasal fracture when she was younger.  Denies loss conscious, fever, vision change.  She has had intermittent headaches that respond somewhat to Advil.  She contacted her PCP, advised that she come in for an evaluation.   Current Facility-Administered Medications:  .  betamethasone acetate-betamethasone sodium phosphate (CELESTONE) injection 3 mg, 3 mg, Intramuscular, Once, Felecia Shelling, DPM  Current Outpatient Medications:  .  albuterol (PROVENTIL HFA;VENTOLIN HFA) 108 (90 BASE) MCG/ACT inhaler, Inhale 2 puffs into the lungs every 4 (four) hours as needed for wheezing or shortness of breath., Disp: , Rfl:  .  allopurinol (ZYLOPRIM) 100 MG tablet, Take 100 mg by mouth daily. Patient takes in am, Disp: , Rfl:  .  azelastine (OPTIVAR) 0.05 % ophthalmic solution, Apply 1 drop to eye daily as needed. Both eyes, Disp: , Rfl:  .  BIOTIN PO, Take by mouth daily., Disp: , Rfl:  .  buPROPion (WELLBUTRIN XL) 150 MG 24 hr tablet, Take 150 mg by mouth every morning., Disp: , Rfl:  .  buPROPion (WELLBUTRIN XL) 300 MG 24 hr tablet, Take 300 mg by mouth daily., Disp: , Rfl:  .  Cholecalciferol (VITAMIN D-3 PO), Take by mouth daily., Disp: , Rfl:  .  cyclobenzaprine (FLEXERIL) 10 MG tablet, Take 10 mg by mouth 3 (three) times daily as needed for muscle spasms., Disp: , Rfl:  .  EPINEPHrine 0.3 mg/0.3 mL IJ SOAJ injection, Inject into the muscle once., Disp: , Rfl:  .  fexofenadine (ALLEGRA) 180 MG tablet, Take 180 mg by mouth daily., Disp: , Rfl:  .  hydrochlorothiazide (HYDRODIURIL) 25 MG tablet, Take 25 mg by mouth daily. Patient takes in am, Disp: , Rfl:  .  lansoprazole (PREVACID) 15 MG capsule, Take 15 mg  by mouth daily at 12 noon., Disp: , Rfl:  .  lisinopril (PRINIVIL,ZESTRIL) 10 MG tablet, Take 10 mg by mouth daily. Patient takes in am, Disp: , Rfl:  .  meloxicam (MOBIC) 15 MG tablet, Take 15 mg by mouth daily as needed., Disp: , Rfl:  .  Multiple Vitamin (MULTIVITAMIN) tablet, Take 1 tablet by mouth daily., Disp: , Rfl:  .  Norgestimate-Ethinyl Estradiol Triphasic (TRI-ESTARYLLA) 0.18/0.215/0.25 MG-35 MCG tablet, Take 1 tablet by mouth daily., Disp: , Rfl:  .  PROAIR RESPICLICK 108 (90 Base) MCG/ACT AEPB, Take 1 puff by mouth daily as needed., Disp: , Rfl:  .  sertraline (ZOLOFT) 50 MG tablet, Take 100 mg by mouth daily. , Disp: , Rfl:    Allergies  Allergen Reactions  . Augmentin [Amoxicillin-Pot Clavulanate] Hives, Itching and Nausea And Vomiting  . Sulfa Antibiotics Anaphylaxis  . Amoxicillin Nausea And Vomiting  . Doxycycline Hives  . Sucralose Other (See Comments)    Pt experience headache, N/V with all artificial sweeteners.   Marland Kitchen Spironolactone Other (See Comments)    Hypotension, incoherent     Past Medical History:  Diagnosis Date  . Anxiety   . Bacterial pneumonia 02/08/2014  . Chronic bronchitis ([facility])    "got it q yr when I smoked" (02/16/2014)  . Family history of anesthesia complication    father- severe vomiting   . GERD (gastroesophageal reflux disease)   . Hypertension   .  Kidney stones   . Pneumonia 1980's X 1; 2000     Past Surgical History:  Procedure Laterality Date  . BACK SURGERY    . CARPAL TUNNEL RELEASE Bilateral 2008  . CYSTOSCOPY WITH RETROGRADE PYELOGRAM, URETEROSCOPY AND STENT PLACEMENT Left 12/19/2012   Procedure: CYSTOSCOPY WITH RETROGRADE PYELOGRAM, URETEROSCOPY AND STENT PLACEMENT;  Surgeon: Sebastian Ache, MD;  Location: [facility];  Service: Urology;  Laterality: Left;  . CYSTOSCOPY WITH STENT PLACEMENT  ~ 2004; ~ 2006; ~ 2010   "took all the stents out myself" (02/16/2014)  . HOLMIUM LASER APPLICATION Left 12/19/2012   Procedure: HOLMIUM LASER  APPLICATION;  Surgeon: Sebastian Ache, MD;  Location: [facility];  Service: Urology;  Laterality: Left;  . LUMBAR DISC SURGERY  1992   "removed herniated pieces L4-5"  . TONSILLECTOMY  2002   "one has grown back"  . WISDOM TOOTH EXTRACTION      Family History  Problem Relation Age of Onset  . Hypertension Mother   . Hypertension Father   . Heart attack Father   . Hyperlipidemia Father     Social History   Tobacco Use  . Smoking status: Former Smoker    Packs/day: 0.50    Years: 15.00    Pack years: 7.50    Types: Cigarettes    Quit date: 01/07/2009    Years since quitting: 10.6  . Smokeless tobacco: Never Used  Substance Use Topics  . Alcohol use: Yes    Comment: 02/16/2014 "might drink 1 drink/month; if that"  . Drug use: No    ROS   Objective:   Vitals: BP (!) 156/94 (BP Location: Left Arm)   Pulse 86   Temp 98.2 F (36.8 C) (Oral)   Resp 18   LMP 03/09/2019   SpO2 98%   Physical Exam Constitutional:      General: She is not in acute distress.    Appearance: Normal appearance. She is well-developed. She is not ill-appearing, toxic-appearing or diaphoretic.  HENT:     Head: Normocephalic and atraumatic.     Right Ear: External ear normal.     Left Ear: External ear normal.     Nose: Nasal tenderness (throughout worse over bridge of her nose) present. No nasal deformity, septal deviation or laceration.     Mouth/Throat:     Mouth: Mucous membranes are moist.     Pharynx: Oropharynx is clear.  Eyes:     General: No scleral icterus.       Right eye: No discharge.        Left eye: No discharge.     Extraocular Movements: Extraocular movements intact.     Conjunctiva/sclera: Conjunctivae normal.     Pupils: Pupils are equal, round, and reactive to light.  Cardiovascular:     Rate and Rhythm: Normal rate.  Pulmonary:     Effort: Pulmonary effort is normal.  Skin:    General: Skin is warm and dry.  Neurological:     General: No focal deficit present.      Mental Status: She is alert and oriented to person, place, and time.     Cranial Nerves: No cranial nerve deficit.     Motor: No weakness.     Coordination: Coordination normal.     Gait: Gait normal.     Deep Tendon Reflexes: Reflexes normal.  Psychiatric:        Mood and Affect: Mood normal.        Behavior: Behavior normal.  Thought Content: Thought content normal.        Judgment: Judgment normal.     DG Nasal Bones  Result Date: 09/02/2019 CLINICAL DATA:  Nose pain after fall on Monday. EXAM: NASAL BONES - 3+ VIEW COMPARISON:  Maxillofacial CT 08/20/2014 FINDINGS: There is likely soft tissue swelling over the right nasal bridge. No visible fracture defect to the nasal arch or septum. No visible hemosinus. Prominent sellar size, stable from 2016 when there is no associated mass by CT. IMPRESSION: Negative for nasal bone fracture. Electronically Signed   By: Marnee Spring M.D.   On: 09/02/2019 10:13    Assessment and Plan :   PDMP not reviewed this encounter.  1. Nasal pain   2. Facial pain   3. Accidental fall, initial encounter   4. Contusion of nose, initial encounter     X-ray was negative.  Recommended conservative management with naproxen for facial/nasal contusion.  No signs of brain injury on exam. Counseled patient on potential for adverse effects with medications prescribed/recommended today, ER and return-to-clinic precautions discussed, patient verbalized understanding.    Wallis Bamberg, PA-C 09/02/19 1032

## 2020-06-22 ENCOUNTER — Telehealth: Payer: Self-pay

## 2020-06-22 NOTE — Telephone Encounter (Signed)
Attempt made to contact Katrina Murphy is a 45 y.o. female re: New Patient appointment with Dr. Florian Buff. Pt was not available.  LM on the VM for the patient to call me back.

## 2021-05-31 ENCOUNTER — Ambulatory Visit (INDEPENDENT_AMBULATORY_CARE_PROVIDER_SITE_OTHER): Payer: 59 | Admitting: Pulmonary Disease

## 2021-05-31 ENCOUNTER — Encounter: Payer: Self-pay | Admitting: Pulmonary Disease

## 2021-05-31 VITALS — BP 132/74 | HR 72 | Temp 97.9°F | Ht 63.0 in | Wt 325.4 lb

## 2021-05-31 DIAGNOSIS — G4710 Hypersomnia, unspecified: Secondary | ICD-10-CM

## 2021-05-31 DIAGNOSIS — G4733 Obstructive sleep apnea (adult) (pediatric): Secondary | ICD-10-CM | POA: Diagnosis not present

## 2021-05-31 MED ORDER — MODAFINIL 200 MG PO TABS: 200.0000 mg | ORAL_TABLET | Freq: Every day | ORAL | 3 refills | Status: DC

## 2021-05-31 NOTE — Assessment & Plan Note (Addendum)
CPAP download was reviewed which shows good compliance and no residual events on auto settings 5 to 12 cm with maximum pressure of 11.6 cm.  She is compliant and CPAP is only helped improve her daytime somnolence and fatigue  Weight loss encouraged, compliance with goal of at least 4-6 hrs every night is the expectation. Advised against medications with sedative side effects Cautioned against driving when sleepy - understanding that sleepiness will vary on a day to day basis
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
## 2021-05-31 NOTE — Patient Instructions (Signed)
  X CPAP titration followed by MSLT - schedule by June 10 or september  X Rx for provigil  will be sent in   ( Stop med for 2 weeks prior to test )

## 2021-05-31 NOTE — Assessment & Plan Note (Signed)
Persistent hypersomnolence dating back to her high school years.  We have to rule out narcolepsy without cataplexy. She is on gabapentin but dose does not appear to be too high and she needs this for anxiety. She will need titration study followed by MSLT.  She is planning a trip to [place] in 10 days. We will go out and prescribe her modafinil given her issues with driving.  If we are unable to schedule her testing within the next 10 days, then we will reschedule after she returns from [place] in September. She will need to be off modafinil for at least 2 weeks prior to study.  For her CPAP titration we can start at 10 cm

## 2021-06-02 ENCOUNTER — Telehealth: Payer: Self-pay | Admitting: Pulmonary Disease

## 2021-06-07 NOTE — Telephone Encounter (Signed)
Spoke to pt and made her aware the sleep lab is booking into the end of June and into July at this time and since she has to have a night time study and then stay for study the next day it may be more into July.  She states she will go ahead and start taking her meds and also she will not be back in town until end of Sept. She will call us prior to her return so we can get auth from insurance then and then get her scheduled in Oct.  Told her I would make [facility] - who does our auths aware.  Nothing further needed at this time.

## 2021-07-03 ENCOUNTER — Telehealth: Payer: Self-pay | Admitting: Pulmonary Disease

## 2021-07-03 NOTE — Telephone Encounter (Signed)
Called patient but she did not answer. Left message for her to call back.  

## 2021-07-05 NOTE — Telephone Encounter (Signed)
Called and spoke with patient. She stated that she is in need of a refill on her modafinil 200mg . The original prescription was sent to a local pharmacy back in May but she is currently in June until the end of September. She wants to use [facility]. She is aware that Dr. October will need to approve the RX.   Dr. Vassie Loll, please advise if you are ok with this refill.   While on the phone, she also mentioned that she is now ready to get scheduled for her sleep study. She received a notice from her insurance stating the authorization will end on October 3rd.   PCCs, can someone advise on the sleep studies?

## 2021-07-10 MED ORDER — MODAFINIL 200 MG PO TABS: 200.0000 mg | ORAL_TABLET | Freq: Every day | ORAL | 3 refills | Status: DC

## 2021-07-10 NOTE — Telephone Encounter (Signed)
Dr Vassie Loll  Can you approve the refill for this patents Modafinil 200mg  and having it sent to [facility]. I pended the medication below for you.  Please advise sir

## 2021-07-10 NOTE — Telephone Encounter (Signed)
Refill for Provigil 200 sent to [facility]

## 2021-07-10 NOTE — Telephone Encounter (Signed)
Called and spoke with pt letting her know that [facility] sent Rx for provigil to mail order pharmacy for her and she verbalized understanding. Nothing further needed.

## 2021-10-08 ENCOUNTER — Encounter (HOSPITAL_BASED_OUTPATIENT_CLINIC_OR_DEPARTMENT_OTHER): Payer: 59 | Admitting: Pulmonary Disease

## 2021-10-09 ENCOUNTER — Ambulatory Visit (HOSPITAL_BASED_OUTPATIENT_CLINIC_OR_DEPARTMENT_OTHER): Payer: 59 | Attending: Pulmonary Disease | Admitting: Pulmonary Disease

## 2021-10-09 ENCOUNTER — Encounter (HOSPITAL_BASED_OUTPATIENT_CLINIC_OR_DEPARTMENT_OTHER): Payer: 59 | Admitting: Pulmonary Disease

## 2021-10-09 DIAGNOSIS — G4733 Obstructive sleep apnea (adult) (pediatric): Secondary | ICD-10-CM | POA: Diagnosis present

## 2021-10-10 ENCOUNTER — Ambulatory Visit (HOSPITAL_BASED_OUTPATIENT_CLINIC_OR_DEPARTMENT_OTHER): Payer: 59 | Attending: Pulmonary Disease | Admitting: Pulmonary Disease

## 2021-10-10 DIAGNOSIS — G4733 Obstructive sleep apnea (adult) (pediatric): Secondary | ICD-10-CM | POA: Diagnosis present

## 2021-10-10 DIAGNOSIS — G4711 Idiopathic hypersomnia with long sleep time: Secondary | ICD-10-CM | POA: Insufficient documentation

## 2021-10-10 DIAGNOSIS — G4710 Hypersomnia, unspecified: Secondary | ICD-10-CM

## 2021-10-11 DIAGNOSIS — G4733 Obstructive sleep apnea (adult) (pediatric): Secondary | ICD-10-CM | POA: Diagnosis not present

## 2021-10-11 NOTE — Procedures (Signed)
Patient Name: Katrina Murphy, Katrina Murphy Date: 10/10/2021 Gender: Female D.O.B: 03-Jan-1975 Age (years): 12 Referring Provider: Kara Mead MD, ABSM Height (inches): 63 Interpreting Physician: Kara Mead MD, ABSM Weight (lbs): 315 RPSGT: Jacolyn Reedy BMI: 56 MRN: [id number] Neck Size: 17.00 <br> <br> CLINICAL INFORMATION Sleep Study Type: MSLT    The patient was referred to the sleep center for evaluation of daytime sleepiness.    Epworth Sleepiness Score: 22  SLEEP STUDY TECHNIQUE A Multiple Sleep Latency Test was performed after an overnight polysomnogram according to the AASM scoring manual v2.3 (April 2016) and clinical guidelines. Five nap opportunities occurred over the course of the test which followed an overnight polysomnogram. The channels recorded and monitored were frontal, central, and occipital electroencephalography (EEG), right and left electrooculogram (EOG), chin electromyography (EMG), and electrocardiogram (EKG).  MEDICATIONS Medications taken by the patient : GABAPENTIN, Diazapam Medications administered by patient during sleep study : No sleep medicine administered.   IMPRESSIONS - Total number of naps attempted: 5 . Total number of naps with sleep attained:5 . The Mean Sleep Latency was 4.60 minutes. There were 0 sleep-onset REM periods. - The patient appears to have pathologic sleepiness, evidenced by a short mean sleep latency (8 minutes or less) on this MSLT.   DIAGNOSIS - Hypersomnolence (G47.10) - Idiopathic hypersomnia (G47.11). The lack of SOREms makes narcolepsy unlikely. - Note that this study was preceded by an overnight CPAP titrations tudy showing CPAP requirement of 14cm. Long periods of REM sleep were noted in early morning hoursThe patient is maintained on CPAP 10 cm with good compliance.   RECOMMENDATIONS - Return for follow up and management of hypersomnolence. Consider using stimulants if this persists. - Return for follow up to  evaluate other causes of excessive daytime sleepiness.    Kara Mead MD Board Certified in [place]

## 2021-10-11 NOTE — Procedures (Signed)
Patient Name: Katrina Murphy, Katrina Murphy Date: 10/09/2021 Gender: Female D.O.B: [birth year] Age (years): 25 Referring Provider: Kara Mead MD, ABSM Height (inches): 63 Interpreting Physician: Kara Mead MD, ABSM Weight (lbs): 315 RPSGT: Zadie Rhine BMI: 56 MRN: [id number] Neck Size: 17.00 <br> <br> CLINICAL INFORMATION The patient is referred for a CPAP titration for evaluation of persistent hypersomnolence inspite of CPAP therapy  04/2019 HST >> AHI 16/h  SLEEP STUDY TECHNIQUE As per the AASM Manual for the Scoring of Sleep and Associated Events v2.3 (April 2016) with a hypopnea requiring 4% desaturations.  The channels recorded and monitored were frontal, central and occipital EEG, electrooculogram (EOG), submentalis EMG (chin), nasal and oral airflow, thoracic and abdominal wall motion, anterior tibialis EMG, snore microphone, electrocardiogram, and pulse oximetry. Continuous positive airway pressure (CPAP) was initiated at the beginning of the study and titrated to treat sleep-disordered breathing.  MEDICATIONS Medications self-administered by patient taken the night of the study : GABAPENTIN, Diazapam  TECHNICIAN COMMENTS Comments added by technician: No restroom vist. Patient had difficulty initiating sleep. Patient was restless all through the night. Comments added by scorer: N/A RESPIRATORY PARAMETERS Optimal PAP Pressure (cm): 14 AHI at Optimal Pressure (/hr): 0 Overall Minimal O2 (%): 82.0 Supine % at Optimal Pressure (%): 100 Minimal O2 at Optimal Pressure (%): 85.0   SLEEP ARCHITECTURE The study was initiated at 10:52:34 PM and ended at 5:57:47 AM.  Sleep onset time was 56.7 minutes and the sleep efficiency was 80.8%%. The total sleep time was 343.5 minutes.  The patient spent 6.0%% of the night in stage N1 sleep, 57.4%% in stage N2 sleep, 0.0%% in stage N3 and 36.7% in REM.Stage REM latency was 144.5 minutes  Wake after sleep onset was 25.0. Alpha intrusion was  absent. Supine sleep was 72.20%.  CARDIAC DATA The 2 lead EKG demonstrated sinus rhythm. The mean heart rate was 71.4 beats per minute. Other EKG findings include: None.   LEG MOVEMENT DATA The total Periodic Limb Movements of Sleep (PLMS) were 0. The PLMS index was 0.0. A PLMS index of <15 is considered normal in adults.  IMPRESSIONS - The optimal PAP pressure was 14 cm of water. - Moderate oxygen desaturations were observed during this titration (min O2 = 82.0%). - No snoring was audible during this study. - No cardiac abnormalities were observed during this study. - Clinically significant periodic limb movements were not noted during this study. Arousals associated with LMs were significant.   DIAGNOSIS - Obstructive Sleep Apnea (G47.33)   RECOMMENDATIONS - Trial of CPAP therapy on 14 cm H2O with a Medium size Resmed Nasal Airfit N20 mask and heated humidification. - Significant Limb movements were noted with arousals - please co relate clinically - Avoid alcohol, sedatives and other CNS depressants that may worsen sleep apnea and disrupt normal sleep architecture. - Sleep hygiene should be reviewed to assess factors that may improve sleep quality. - Weight management and regular exercise should be initiated or continued. - Return to Sleep Center for re-evaluation after 4 weeks of therapy   Kara Mead MD Board Certified in [place]

## 2021-10-12 ENCOUNTER — Other Ambulatory Visit: Payer: Self-pay

## 2021-10-12 DIAGNOSIS — G4733 Obstructive sleep apnea (adult) (pediatric): Secondary | ICD-10-CM

## 2021-10-25 ENCOUNTER — Encounter: Payer: Self-pay | Admitting: Pulmonary Disease

## 2021-10-25 ENCOUNTER — Ambulatory Visit (INDEPENDENT_AMBULATORY_CARE_PROVIDER_SITE_OTHER): Payer: 59 | Admitting: Pulmonary Disease

## 2021-10-25 VITALS — BP 110/70 | HR 65 | Temp 98.4°F | Ht 63.0 in | Wt 326.0 lb

## 2021-10-25 DIAGNOSIS — G4733 Obstructive sleep apnea (adult) (pediatric): Secondary | ICD-10-CM

## 2021-10-25 DIAGNOSIS — G4710 Hypersomnia, unspecified: Secondary | ICD-10-CM

## 2021-10-25 NOTE — Assessment & Plan Note (Signed)
Persistent hypersomnolence in spite of CPAP use.  Reviewed MSLT she does not have SOR EMS to indicate narcolepsy. Emphasized schedule, and daytime naps whenever possible up to 1 hour. I will ask her to take second dose of modafinil in the afternoon around 1 PM assuming that she takes her morning dose at 9 AM

## 2021-10-25 NOTE — Patient Instructions (Signed)
X Increase CPAP to 8-14 cm  Naps x 1 hr ok  X Increase modafinil to 200 mg twice daily
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
## 2021-10-25 NOTE — Assessment & Plan Note (Signed)
Sleep download was reviewed which shows good control of events with average pressure of 11.4 and maximum pressure of 11.8 cm, she is on auto settings 5 to 12 cm. Based on titration study results, we will increase to auto 8 to 14 cm.  I have also asked her to extend sleep time to 7 hours of possible.  She likes AirFit N20 medium mask.  She is compliant and CPAP is only helped improve her daytime somnolence and fatigue  Weight loss encouraged, compliance with goal of at least 4-6 hrs every night is the expectation. Advised against medications with sedative side effects Cautioned against driving when sleepy - understanding that sleepiness will vary on a day to day basis

## 2021-12-08 ENCOUNTER — Telehealth: Payer: Self-pay | Admitting: Pulmonary Disease

## 2021-12-08 ENCOUNTER — Other Ambulatory Visit (HOSPITAL_BASED_OUTPATIENT_CLINIC_OR_DEPARTMENT_OTHER): Payer: Self-pay

## 2021-12-08 MED ORDER — MODAFINIL 200 MG PO TABS: 200.0000 mg | ORAL_TABLET | Freq: Every day | ORAL | 3 refills | Status: DC

## 2021-12-08 NOTE — Telephone Encounter (Signed)
LMOM for Pt letting her know that I sent her refill in to the [facility]. Nothing Further needed.

## 2021-12-16 LAB — COLOGUARD: COLOGUARD: NEGATIVE

## 2021-12-29 ENCOUNTER — Other Ambulatory Visit: Payer: Self-pay | Admitting: Pulmonary Disease

## 2022-01-24 ENCOUNTER — Other Ambulatory Visit: Payer: Self-pay | Admitting: Pulmonary Disease

## 2022-01-24 NOTE — Telephone Encounter (Signed)
30-day supply with 2 refills sent to [facility]

## 2022-02-14 ENCOUNTER — Encounter (HOSPITAL_BASED_OUTPATIENT_CLINIC_OR_DEPARTMENT_OTHER): Payer: Self-pay | Admitting: Pulmonary Disease

## 2022-02-14 ENCOUNTER — Ambulatory Visit (INDEPENDENT_AMBULATORY_CARE_PROVIDER_SITE_OTHER): Payer: 59 | Admitting: Pulmonary Disease

## 2022-02-14 VITALS — BP 140/70 | HR 97 | Temp 98.2°F | Ht 63.0 in | Wt 330.4 lb

## 2022-02-14 DIAGNOSIS — G4733 Obstructive sleep apnea (adult) (pediatric): Secondary | ICD-10-CM | POA: Diagnosis not present

## 2022-02-14 DIAGNOSIS — G4710 Hypersomnia, unspecified: Secondary | ICD-10-CM

## 2022-02-14 MED ORDER — MODAFINIL 200 MG PO TABS: 200.0000 mg | ORAL_TABLET | Freq: Two times a day (BID) | ORAL | 2 refills | Status: DC

## 2022-02-14 NOTE — Addendum Note (Signed)
Addended by: Darliss Ridgel on: 02/14/2022 03:40 PM   Modules accepted: Orders

## 2022-02-14 NOTE — Assessment & Plan Note (Signed)
CPAP download was reviewed, excellent control of pressure on auto settings 8 to 14 cm with average pressure of 13 maximum pressure of 14 cm. She is very compliant, has minimal leak.  CPAP is definitely helped improve her daytime somnolence and fatigue. We will add EPR setting up to as a comfort setting  Weight loss encouraged, compliance with goal of at least 4-6 hrs every night is the expectation. Advised against medications with sedative side effects Cautioned against driving when sleepy - understanding that sleepiness will vary on a day to day basis

## 2022-02-14 NOTE — Progress Notes (Signed)
   Subjective:    Patient ID: Katrina Murphy, female    DOB: [birth year], 47 y.o.   MRN: [id number]  HPI  46 yo for follow-up of OSA and hypersomnolence  in spite of good compliance with CPAP.   -on gabapentin off label for anxiety 05/2021 >> started on modafinil   Chief Complaint  Patient presents with   Follow-up    Had to change humidifier settings to lowest Needs updated instructions on modafinil rx    On her last office visit 09/2021, we adjusted auto CPAP to 8 to 14 cm, her maximum pressure was 12 cm and asked her to take an additional dose of modafinil in the afternoon around 1 PM after the morning dose around 9 AM This changes worked well for her.  On her last drive from [place], she was able to take a second dose which kept her awake through the drive.  She is running out of her pills currently and now needs a refill. Very compliant with her CPAP machine.  Mask leaves a pressure mark on her face feels rested when she wakes up denies sleep pressure in the daytime   Significant tests/ events reviewed  MSLT 10/10/21 >> sleep latency 4.6 mins, 0 SOREMs  CPAP ttiration 10/09/21 >> 14 cm, medium AirFit N20 04/2019 HST >> AHI 16/h  Review of Systems neg for any significant sore throat, dysphagia, itching, sneezing, nasal congestion or excess/ purulent secretions, fever, chills, sweats, unintended wt loss, pleuritic or exertional cp, hempoptysis, orthopnea pnd or change in chronic leg swelling. Also denies presyncope, palpitations, heartburn, abdominal pain, nausea, vomiting, diarrhea or change in bowel or urinary habits, dysuria,hematuria, rash, arthralgias, visual complaints, headache, numbness weakness or ataxia.     Objective:   Physical Exam  Gen. Pleasant, obese, in no distress ENT - no lesions, no post nasal drip Neck: No JVD, no thyromegaly, no carotid bruits Lungs: no use of accessory muscles, no dullness to percussion, decreased without rales or rhonchi   Cardiovascular: Rhythm regular, heart sounds  normal, no murmurs or gallops, no peripheral edema Musculoskeletal: No deformities, no cyanosis or clubbing , no tremors       Assessment & Plan:

## 2022-02-14 NOTE — Assessment & Plan Note (Addendum)
She takes morning dose of modafinil and then an afternoon dose around 1 PM.  She now needs 60 tablets/month, we will modify prescription and send in a refill Tolerating well without side effects, she has a good cardiovascular profile

## 2022-02-14 NOTE — Patient Instructions (Signed)
X add EPR setting +2  Refill modafinil

## 2022-02-28 ENCOUNTER — Telehealth: Payer: Self-pay | Admitting: Pulmonary Disease

## 2022-02-28 ENCOUNTER — Telehealth: Payer: Self-pay

## 2022-02-28 ENCOUNTER — Other Ambulatory Visit (HOSPITAL_COMMUNITY): Payer: Self-pay

## 2022-02-28 NOTE — Telephone Encounter (Signed)
[facility] calling regarding PT RX for Modafinil 200 mg  They are on a long out of stock  for this controlled substance and PT would like it called in to [facility] on [address] They have verified this Katrina Murphy has it.   Adv Dr. Aletha Halim have to sign off on it but we will take care of this ASAP. Thanks.

## 2022-02-28 NOTE — Telephone Encounter (Signed)
Urgent request submitted and pending determination. Will update in additional encounter created.

## 2022-02-28 NOTE — Telephone Encounter (Signed)
PA has been APPROVED from 02/28/2022-03/01/2023

## 2022-02-28 NOTE — Telephone Encounter (Signed)
Patient states RX for Modafinil needs prior authorization. Patient just about out of medication. Pharmacy is [facility]. Patient phone number is [phone].

## 2022-02-28 NOTE — Telephone Encounter (Signed)
Attempted to call pt to let her know that the PA had been approved but line went directly to VM. Left pt a detailed message letting her know this was done. Nothing further needed.

## 2022-02-28 NOTE — Telephone Encounter (Signed)
PA request received via providers office, requested for an expedited request for Modafinil '200MG'$  tablets  Quantity limit PA submitted via CMM to [facility] and is pending determination.  Key: [id number]

## 2022-02-28 NOTE — Telephone Encounter (Signed)
Routing high priority to pharmacy team so they can work on this since pt is almost out of medication.

## 2022-03-01 MED ORDER — MODAFINIL 200 MG PO TABS: 200.0000 mg | ORAL_TABLET | Freq: Two times a day (BID) | ORAL | 2 refills | Status: DC

## 2022-03-01 NOTE — Telephone Encounter (Signed)
Please advise if pharmacy switch is alright

## 2022-03-01 NOTE — Telephone Encounter (Signed)
Modafinil refill has been sent to [facility]

## 2022-03-01 NOTE — Telephone Encounter (Signed)
Attempted to call pt but unable to reach. Left her a detailed message letting her know that her med was sent to local pharmacy for her. Nothing further needed.

## 2022-03-10 ENCOUNTER — Other Ambulatory Visit: Payer: Self-pay

## 2022-03-10 ENCOUNTER — Emergency Department (HOSPITAL_BASED_OUTPATIENT_CLINIC_OR_DEPARTMENT_OTHER)
Admission: EM | Admit: 2022-03-10 | Discharge: 2022-03-10 | Disposition: A | Discharge status: Home / Self Care | Payer: 59 | Attending: Emergency Medicine | Admitting: Emergency Medicine

## 2022-03-10 DIAGNOSIS — T7840XA Allergy, unspecified, initial encounter: Secondary | ICD-10-CM | POA: Diagnosis present

## 2022-03-10 DIAGNOSIS — I10 Essential (primary) hypertension: Secondary | ICD-10-CM | POA: Insufficient documentation

## 2022-03-10 DIAGNOSIS — Z79899 Other long term (current) drug therapy: Secondary | ICD-10-CM | POA: Diagnosis not present

## 2022-03-10 DIAGNOSIS — T782XXA Anaphylactic shock, unspecified, initial encounter: Secondary | ICD-10-CM | POA: Diagnosis not present

## 2022-03-10 LAB — CBC WITH DIFFERENTIAL/PLATELET
Abs Immature Granulocytes: 0.06 10*3/uL (ref 0.00–0.07)
Basophils Absolute: 0.1 10*3/uL (ref 0.0–0.1)
Basophils Relative: 0 %
Eosinophils Absolute: 0.2 10*3/uL (ref 0.0–0.5)
Eosinophils Relative: 2 %
HCT: 40.9 % (ref 36.0–46.0)
Hemoglobin: 13.6 g/dL (ref 12.0–15.0)
Immature Granulocytes: 1 %
Lymphocytes Relative: 22 %
Lymphs Abs: 2.8 10*3/uL (ref 0.7–4.0)
MCH: 29.6 pg (ref 26.0–34.0)
MCHC: 33.3 g/dL (ref 30.0–36.0)
MCV: 89.1 fL (ref 80.0–100.0)
Monocytes Absolute: 0.8 10*3/uL (ref 0.1–1.0)
Monocytes Relative: 7 %
Neutro Abs: 8.5 10*3/uL — ABNORMAL HIGH (ref 1.7–7.7)
Neutrophils Relative %: 68 %
Platelets: 308 10*3/uL (ref 150–400)
RBC: 4.59 MIL/uL (ref 3.87–5.11)
RDW: 13.8 % (ref 11.5–15.5)
WBC: 12.4 10*3/uL — ABNORMAL HIGH (ref 4.0–10.5)
nRBC: 0 % (ref 0.0–0.2)

## 2022-03-10 LAB — BASIC METABOLIC PANEL
Anion gap: 10 (ref 5–15)
BUN: 15 mg/dL (ref 6–20)
CO2: 26 mmol/L (ref 22–32)
Calcium: 8.9 mg/dL (ref 8.9–10.3)
Chloride: 101 mmol/L (ref 98–111)
Creatinine, Ser: 0.68 mg/dL (ref 0.44–1.00)
GFR, Estimated: >60 mL/min (ref >60)
Glucose, Bld: 92 mg/dL (ref 70–99)
Potassium: 3.5 mmol/L (ref 3.5–5.1)
Sodium: 137 mmol/L (ref 135–145)

## 2022-03-10 LAB — HCG, SERUM, QUALITATIVE: Preg, Serum: NEGATIVE

## 2022-03-10 MED ORDER — DIPHENHYDRAMINE HCL 50 MG/ML IJ SOLN
25.0000 mg | Freq: Once | INTRAMUSCULAR | Status: AC
  Administered 2022-03-10: 25 mg via INTRAVENOUS
  Filled 2022-03-10: qty 1

## 2022-03-10 MED ORDER — DIPHENHYDRAMINE HCL 50 MG/ML IJ SOLN
25.0000 mg | Freq: Once | INTRAMUSCULAR | Status: DC
  Filled 2022-03-10: qty 1

## 2022-03-10 MED ORDER — FAMOTIDINE 20 MG PO TABS: 20.0000 mg | ORAL_TABLET | Freq: Two times a day (BID) | ORAL | 0 refills | Status: DC

## 2022-03-10 MED ORDER — METHYLPREDNISOLONE SODIUM SUCC 125 MG IJ SOLR
125.0000 mg | Freq: Once | INTRAMUSCULAR | Status: AC
  Administered 2022-03-10: 125 mg via INTRAVENOUS
  Filled 2022-03-10: qty 2

## 2022-03-10 MED ORDER — EPINEPHRINE 0.3 MG/0.3ML IJ SOAJ
INTRAMUSCULAR | Status: AC
  Administered 2022-03-10: 0.3 mg via INTRAMUSCULAR
  Filled 2022-03-10: qty 0.3

## 2022-03-10 MED ORDER — EPINEPHRINE 0.3 MG/0.3ML IJ SOAJ: 0.3000 mg | Freq: Once | INTRAMUSCULAR | Status: AC

## 2022-03-10 MED ORDER — PREDNISONE 50 MG PO TABS: ORAL_TABLET | ORAL | 0 refills | Status: DC

## 2022-03-10 NOTE — ED Notes (Signed)
Pt in for allergic reaction w/throat swelling per pt. RT assessed and placed on monitor. Pt respiratory status stable on RA w/out distress. Swelling noted on left side of pts throat, uvula normal in size at this time. Pt airway patent with BLBS clear/dim. RT will continue to monitor while at [facility] ED.

## 2022-03-10 NOTE — ED Provider Notes (Signed)
[facility] Provider Note   CSN: [id number] Arrival date & time: 03/10/22  0201     History  Chief Complaint  Patient presents with   Allergic Reaction    Katrina Murphy is a 47 y.o. female.  Patient with a history of hypertension, GERD, kidney stones presenting with concern for allergic reaction.  States about 1 hour ago she felt tightness in her throat, shortness of breath and chest tightness.  She believes she is reacting to an allergy shot that she got earlier in the day or possibly from dust after digging around in some bulbs at the store.  She does have known allergy to dust and mites.  She took Xyzal without relief.  Has an epinephrine pen at home but did not use it herself.  Feels tightness in her chest and shortness of breath with swelling of her throat especially in the left side.  No tongue or lip swelling.  States this has happened before but is uncertain what caused it. She does take lisinopril and has been on that for quite some time.  The history is provided by the patient.  Allergic Reaction Presenting symptoms: difficulty swallowing   Presenting symptoms: no rash        Home Medications Prior to Admission medications   Medication Sig Start Date End Date Taking? Authorizing Provider  allopurinol (ZYLOPRIM) 100 MG tablet Take 100 mg by mouth daily. Patient takes in am    [provider]  azelastine (OPTIVAR) 0.05 % ophthalmic solution Apply 1 drop to eye daily as needed. Both eyes 12/08/18   [provider]  BIOTIN PO Take by mouth daily. Patient not taking: Reported on 02/14/2022    [provider]  buPROPion (WELLBUTRIN XL) 300 MG 24 hr tablet Take 300 mg by mouth daily. 10/01/18   [provider]  Cholecalciferol (VITAMIN D-3 PO) Take by mouth daily.    [provider]  cyclobenzaprine (FLEXERIL) 10 MG tablet Take 10 mg by mouth 3 (three) times daily as needed for muscle  spasms.    [provider]  diazepam (VALIUM) 5 MG tablet Take 5 mg by mouth 2 (two) times daily as needed.    [provider]  EPINEPHrine 0.3 mg/0.3 mL IJ SOAJ injection Inject into the muscle once. 12/08/18   [provider]  fexofenadine (ALLEGRA) 180 MG tablet Take 180 mg by mouth daily.    [provider]  gabapentin (NEURONTIN) 300 MG capsule Take 300 mg by mouth 3 (three) times daily. 03/14/21   [provider]  gabapentin (NEURONTIN) 600 MG tablet gabapentin 600 mg tablet  Take 1 tablet every day by oral route at bedtime.    [provider]  hydrochlorothiazide (HYDRODIURIL) 25 MG tablet Take 25 mg by mouth daily. Patient takes in am    [provider]  lisinopril (PRINIVIL,ZESTRIL) 10 MG tablet Take 10 mg by mouth daily. Patient takes in am    [provider]  meloxicam (MOBIC) 15 MG tablet Take 15 mg by mouth daily as needed. 06/20/18   [provider]  modafinil (PROVIGIL) 200 MG tablet Take 1 tablet (200 mg total) by mouth 2 (two) times daily. 03/01/22   Rigoberto Noel, MD  Multiple Vitamin (MULTIVITAMIN) tablet Take 1 tablet by mouth daily.    [provider]  Norgestimate-Ethinyl Estradiol Triphasic 0.18/0.215/0.25 MG-35 MCG tablet Take 1 tablet by mouth daily.    [provider]  [place] [id number] (  90 Base) MCG/ACT AEPB Take 1 puff by mouth daily as needed. 12/08/18   [provider]  sertraline (ZOLOFT) 50 MG tablet Take 100 mg by mouth daily.     [provider]      Allergies    Augmentin [amoxicillin-pot clavulanate], Sulfa antibiotics, Amoxicillin, Doxycycline, Sucralose, and Spironolactone    Review of Systems   Review of Systems  Constitutional:  Negative for activity change, appetite change and fever.  HENT:  Positive for sore throat and trouble swallowing. Negative for congestion.   Respiratory:  Positive for shortness of breath. Negative for cough and  chest tightness.   Cardiovascular:  Negative for chest pain.  Gastrointestinal:  Negative for abdominal pain, nausea and vomiting.  Genitourinary:  Negative for dysuria and hematuria.  Musculoskeletal:  Negative for arthralgias and myalgias.  Skin:  Negative for rash.  Neurological:  Negative for dizziness, weakness and headaches.   all other systems are negative except as noted in the HPI and PMH.    Physical Exam Updated Vital Signs BP (!) 143/74   Pulse 94   Temp 99 F (37.2 C)   Resp (!) 24   Ht '5\' 2"'$  (1.575 m)   Wt (!) 147.4 kg   SpO2 97%   BMI 59.44 kg/m  Physical Exam Vitals and nursing note reviewed.  Constitutional:      General: She is not in acute distress.    Appearance: She is well-developed. She is obese.     Comments: Anxious  HENT:     Head: Normocephalic and atraumatic.     Mouth/Throat:     Pharynx: No oropharyngeal exudate.     Comments: No tongue or lip swelling, no stridor.  Asymmetric swelling to left posterior pharynx, controlling secretions, uvula midline Eyes:     Conjunctiva/sclera: Conjunctivae normal.     Pupils: Pupils are equal, round, and reactive to light.  Neck:     Comments: No meningismus. Cardiovascular:     Rate and Rhythm: Normal rate and regular rhythm.     Heart sounds: Normal heart sounds. No murmur heard. Pulmonary:     Effort: Pulmonary effort is normal. No respiratory distress.     Breath sounds: Normal breath sounds. No wheezing.  Abdominal:     Palpations: Abdomen is soft.     Tenderness: There is no abdominal tenderness. There is no guarding or rebound.  Musculoskeletal:        General: No tenderness. Normal range of motion.     Cervical back: Normal range of motion and neck supple.  Skin:    General: Skin is warm.     Findings: Rash present.  Neurological:     Mental Status: She is alert and oriented to person, place, and time.     Cranial Nerves: No cranial nerve deficit.     Motor: No abnormal muscle tone.      Coordination: Coordination normal.     Comments:  5/5 strength throughout. CN 2-12 intact.Equal grip strength.   Psychiatric:        Behavior: Behavior normal.     ED Results / Procedures / Treatments   Labs (all labs ordered are listed, but only abnormal results are displayed) Labs Reviewed  CBC WITH DIFFERENTIAL/PLATELET - Abnormal; Notable for the following components:      Result Value   WBC 12.4 (*)    Neutro Abs 8.5 (*)    All other components within normal limits  BASIC METABOLIC PANEL  HCG, SERUM, QUALITATIVE  EKG EKG Interpretation  Date/Time:  Saturday March 10 2022 02:22:06 EST Ventricular Rate:  79 PR Interval:  192 QRS Duration: 97 QT Interval:  382 QTC Calculation: 438 R Axis:   -37 Text Interpretation: Sinus rhythm Left axis deviation Low voltage, precordial leads No significant change was found Confirmed by Ezequiel Essex [phone]) on 03/10/2022 2:37:08 AM  Radiology No results found.  Procedures .Critical Care  Performed by: Ezequiel Essex, MD Authorized by: Ezequiel Essex, MD   Critical care provider statement:    Critical care time (minutes):  46   Critical care time was exclusive of:  Separately billable procedures and treating other patients   Critical care was necessary to treat or prevent imminent or life-threatening deterioration of the following conditions: anaphylaxis.   Critical care was time spent personally by me on the following activities:  Development of treatment plan with patient or surrogate, discussions with consultants, evaluation of patient's response to treatment, examination of patient, ordering and review of laboratory studies, ordering and review of radiographic studies, ordering and performing treatments and interventions, pulse oximetry, re-evaluation of patient's condition, review of old charts, blood draw for specimens and obtaining history from patient or surrogate   I assumed direction of critical care for this patient  from another provider in my specialty: no     Care discussed with: admitting provider       Medications Ordered in ED Medications  EPINEPHrine (EPI-PEN) injection 0.3 mg (0.3 mg Intramuscular Given 03/10/22 0214)  methylPREDNISolone sodium succinate (SOLU-MEDROL) 125 mg/2 mL injection 125 mg (125 mg Intravenous Given 03/10/22 0216)  diphenhydrAMINE (BENADRYL) injection 25 mg (25 mg Intravenous Given 03/10/22 0217)    ED Course/ Medical Decision Making/ A&P                             Medical Decision Making Amount and/or Complexity of Data Reviewed Labs: ordered. Decision-making details documented in ED Course. Radiology: ordered and independent interpretation performed. Decision-making details documented in ED Course. ECG/medicine tests: ordered and independent interpretation performed. Decision-making details documented in ED Course.  Risk Prescription drug management.   Allergic reaction with concern for anaphylaxis.  Does have throat tightness and swelling on arrival.  IM epinephrine given as well as steroids, Benadryl and Pepcid.  Unclear whether this represents angioedema.  She does take ACE inhibitor.  EKG without acute ischemia. After epinephrine, steroids and antihistamines, patient feels great improvement.  Left-sided throat swelling has improved.  No tongue or lip swelling.  She denies any new ingestions.  Never had this severe of a reaction in the past.  Discussed with patient that angioedema is considered possibility given her lisinopril use and recommended she discontinue use of lisinopril and avoid ACE inhibitors in the future.  Patient observed in the ED for 3.5 hours after epinephrine injection.  She reports improvement and almost resolution of her symptoms.  Reexamination of her oropharynx shows improvement to and resolution of the left-sided swelling and uvula is midline.  She is controlling secretions tolerating p.o. and her voice is normal.  She is requesting  discharge home.  Continue steroids and antihistamines.  She has epinephrine pen at home for use and is instructed on its use.  She understands she must come to the hospital to be observed after using the epinephrine pen.  She will follow-up with her PCP as well as allergist to discuss further evaluation of her medication list.  Advised that she should stop taking  lisinopril in the meantime and avoid it until she is told otherwise by her doctors.  Return to the ED with new or worsening symptoms.        Final Clinical Impression(s) / ED Diagnoses Final diagnoses:  Anaphylaxis, initial encounter    Rx / DC Orders ED Discharge Orders     None         Cherron Blitzer, Annie Main, MD 03/10/22 [phone]

## 2022-03-10 NOTE — ED Triage Notes (Signed)
To ED with c/o allergic reaction. Unknown allergen, reports she had a change in medication recently and was digging around in some bulbs that were dusty earlier today. Reports feeling shortness of breath, and throat swelling. RT and MD at bedside.

## 2022-03-10 NOTE — ED Notes (Signed)
Tolerating PO fluids without difficulty.

## 2022-03-10 NOTE — ED Notes (Signed)
Pt reports feeling "much better". MD notified. Respirations are equal and nonlabored. Skin warm and dry.

## 2022-03-10 NOTE — Discharge Instructions (Addendum)
As we discussed, we are not certain what caused your allergic reaction.  Your blood pressure medication lisinopril may be responsible.  You should stop taking lisinopril as well as avoid any ACE inhibitors or ARB medications.  Follow-up with your primary doctor as well as your allergist for further recommendations regarding your medications.  Take the steroids as prescribed as well as Benadryl as needed for itching.  Use your epinephrine pen for severe allergic reaction only with difficulty breathing, difficulty swallowing, tongue or lip swelling or throat swelling.  If you use your epinephrine pen, you must come to the emergency department to be evaluated after. Return to the ED with any new or worsening symptoms.

## 2022-09-01 IMAGING — DX DG NASAL BONES 3+V
3 series · 3 of 3 positions shown · non-contrast
Comparison: Maxillofacial CT 08/20/2014

CLINICAL DATA: Nose pain after fall on [REDACTED].

EXAM:
NASAL BONES - 3+ VIEW

[[person_name]]
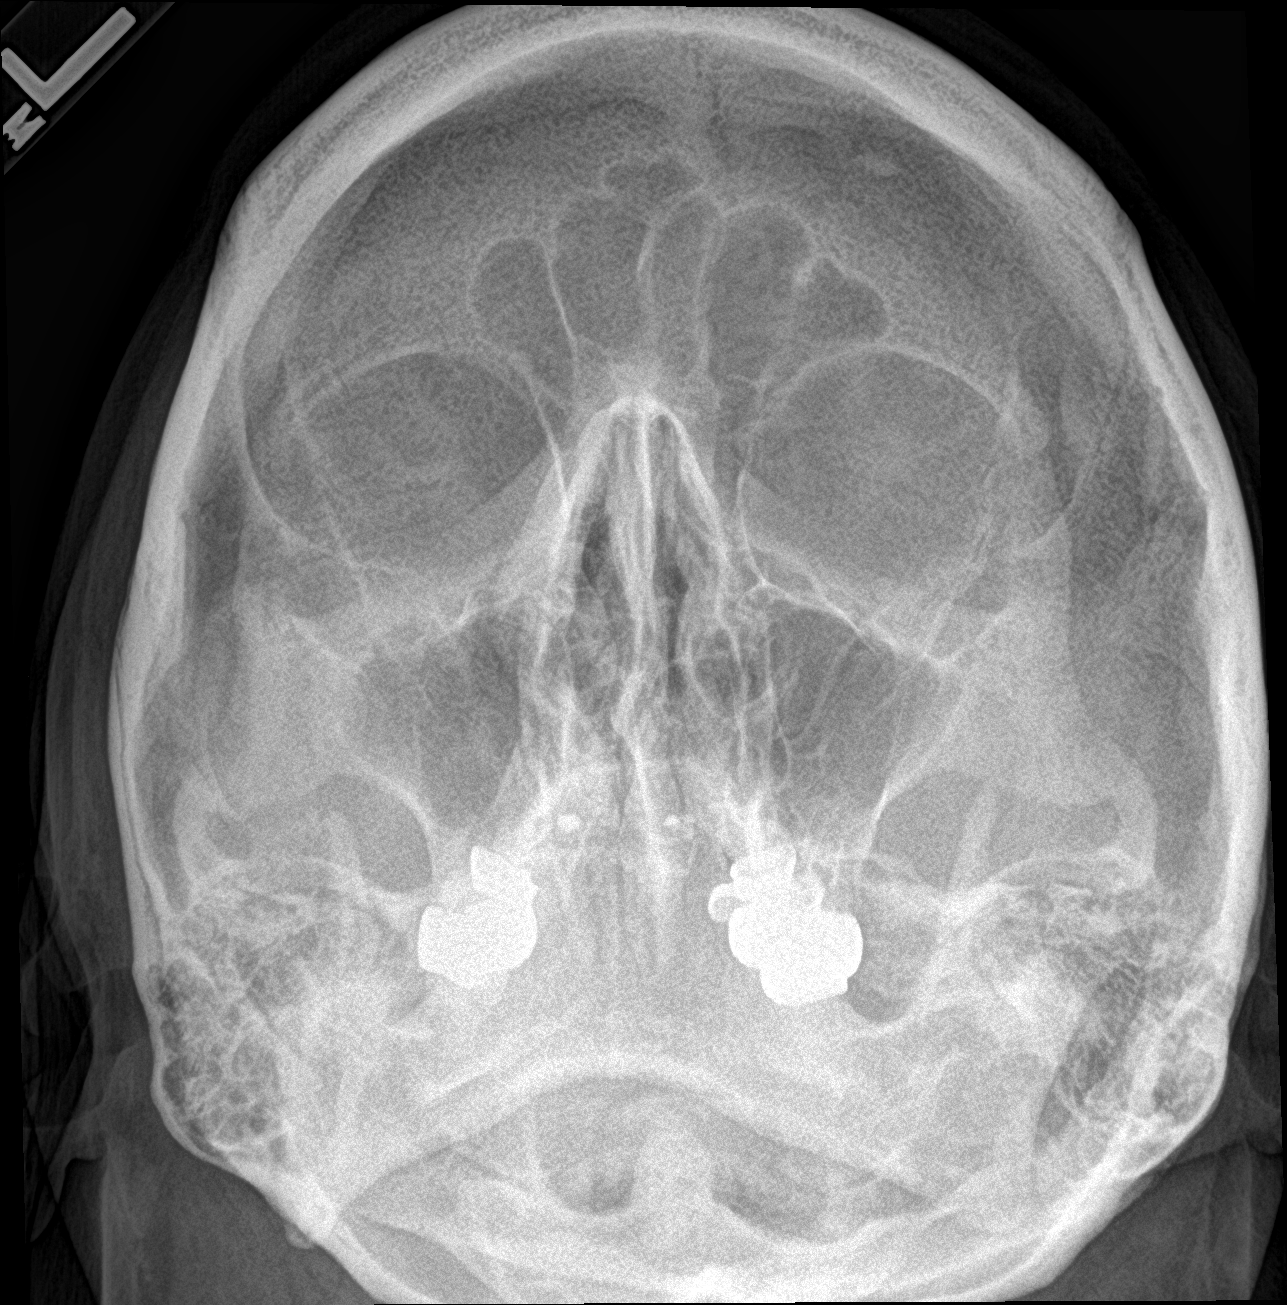

[nasal lat (1 of 2)]
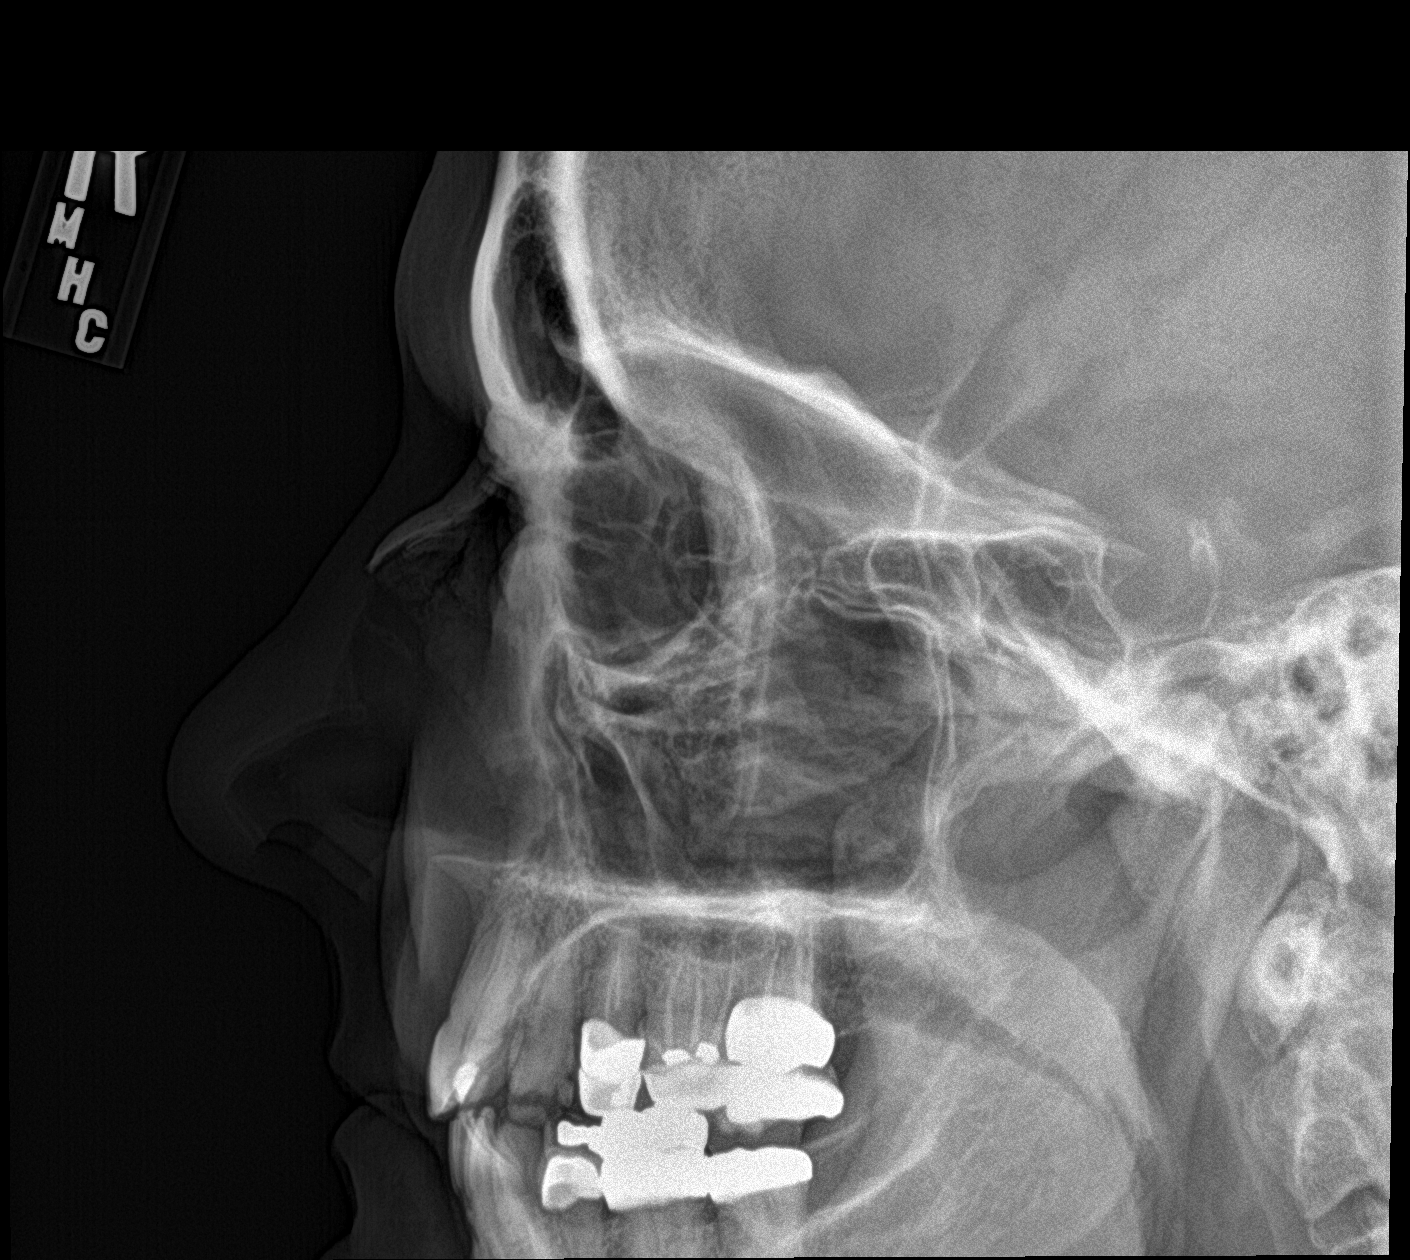

[nasal lat (2 of 2)]
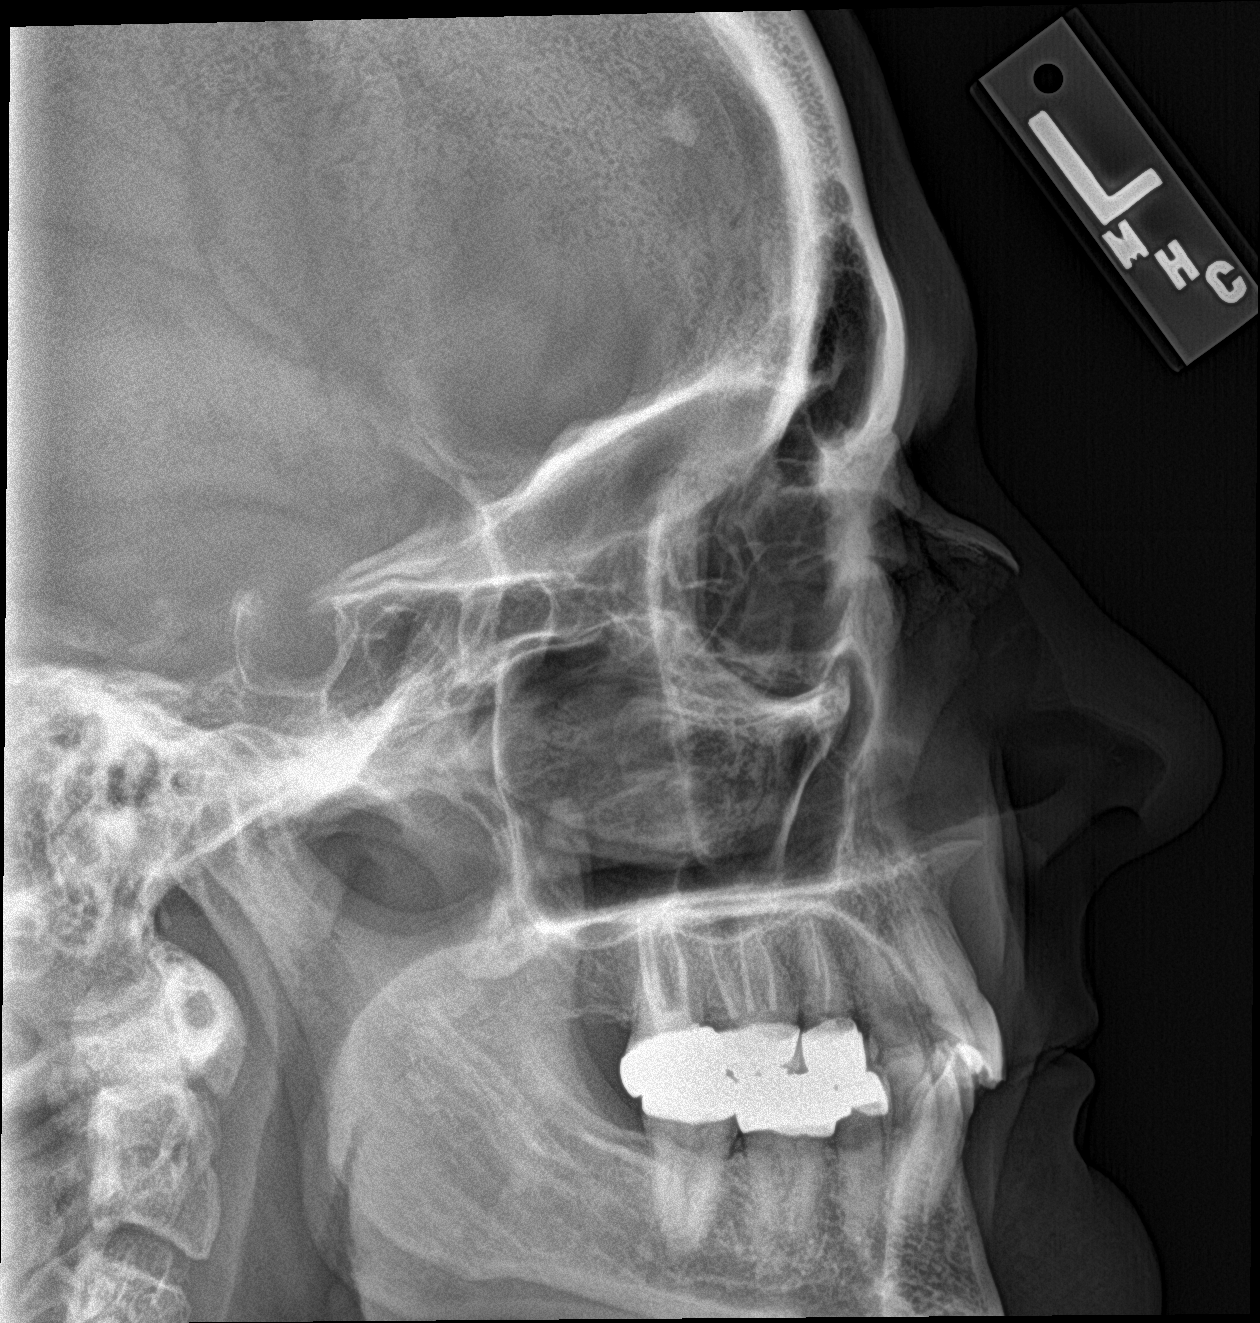

[3 of 3 positions shown; findings below may reference images not displayed]

FINDINGS: There is likely soft tissue swelling over the right nasal bridge. No
visible fracture defect to the nasal arch or septum. No visible
hemosinus.

Prominent sellar size, stable from 4649 when there is no associated
mass by CT.
IMPRESSION: Negative for nasal bone fracture.

## 2022-09-06 ENCOUNTER — Other Ambulatory Visit (HOSPITAL_BASED_OUTPATIENT_CLINIC_OR_DEPARTMENT_OTHER): Payer: Self-pay | Admitting: Pulmonary Disease

## 2022-09-28 ENCOUNTER — Other Ambulatory Visit (HOSPITAL_BASED_OUTPATIENT_CLINIC_OR_DEPARTMENT_OTHER): Payer: Self-pay | Admitting: Pulmonary Disease

## 2022-10-02 ENCOUNTER — Other Ambulatory Visit (HOSPITAL_BASED_OUTPATIENT_CLINIC_OR_DEPARTMENT_OTHER): Payer: Self-pay | Admitting: Pulmonary Disease

## 2022-10-02 NOTE — Telephone Encounter (Signed)
60 tabs x 5 refill sent to [facility]

## 2022-10-09 ENCOUNTER — Other Ambulatory Visit (HOSPITAL_BASED_OUTPATIENT_CLINIC_OR_DEPARTMENT_OTHER): Payer: Self-pay

## 2022-10-09 MED ORDER — WEGOVY 0.25 MG/0.5ML ~~LOC~~ SOAJ
0.2500 mg | SUBCUTANEOUS | 0 refills | Status: DC
  Filled 2022-10-09: qty 2, 28d supply, fill #0

## 2022-10-15 ENCOUNTER — Other Ambulatory Visit (HOSPITAL_BASED_OUTPATIENT_CLINIC_OR_DEPARTMENT_OTHER): Payer: Self-pay

## 2022-10-17 ENCOUNTER — Other Ambulatory Visit (HOSPITAL_BASED_OUTPATIENT_CLINIC_OR_DEPARTMENT_OTHER): Payer: Self-pay

## 2022-10-31 ENCOUNTER — Other Ambulatory Visit (HOSPITAL_BASED_OUTPATIENT_CLINIC_OR_DEPARTMENT_OTHER): Payer: Self-pay

## 2022-11-01 ENCOUNTER — Other Ambulatory Visit (HOSPITAL_BASED_OUTPATIENT_CLINIC_OR_DEPARTMENT_OTHER): Payer: Self-pay

## 2022-11-13 ENCOUNTER — Other Ambulatory Visit (HOSPITAL_BASED_OUTPATIENT_CLINIC_OR_DEPARTMENT_OTHER): Payer: Self-pay

## 2023-04-01 DIAGNOSIS — F988 Other specified behavioral and emotional disorders with onset usually occurring in childhood and adolescence: Secondary | ICD-10-CM | POA: Insufficient documentation

## 2023-04-28 ENCOUNTER — Other Ambulatory Visit (HOSPITAL_BASED_OUTPATIENT_CLINIC_OR_DEPARTMENT_OTHER): Payer: Self-pay | Admitting: Pulmonary Disease

## 2023-04-29 NOTE — Telephone Encounter (Signed)
 Refill sent. She will have to keep her appointment on 5/15 for any further refills

## 2023-04-29 NOTE — Telephone Encounter (Signed)
**Note De-identified  [place] Obfuscation** Please advise 

## 2023-05-03 ENCOUNTER — Ambulatory Visit (HOSPITAL_COMMUNITY)
Admission: EM | Admit: 2023-05-03 | Discharge: 2023-05-03 | Disposition: A | Discharge status: Home / Self Care | Attending: Family Medicine | Admitting: Family Medicine

## 2023-05-03 ENCOUNTER — Encounter (HOSPITAL_COMMUNITY): Payer: Self-pay | Admitting: Emergency Medicine

## 2023-05-03 DIAGNOSIS — M545 Low back pain, unspecified: Secondary | ICD-10-CM

## 2023-05-03 MED ORDER — KETOROLAC TROMETHAMINE 30 MG/ML IJ SOLN
30.0000 mg | Freq: Once | INTRAMUSCULAR | Status: AC
  Administered 2023-05-03: 30 mg via INTRAMUSCULAR

## 2023-05-03 MED ORDER — TIZANIDINE HCL 4 MG PO TABS: 4.0000 mg | ORAL_TABLET | Freq: Three times a day (TID) | ORAL | 0 refills | Status: DC | PRN

## 2023-05-03 MED ORDER — KETOROLAC TROMETHAMINE 10 MG PO TABS: 10.0000 mg | ORAL_TABLET | Freq: Four times a day (QID) | ORAL | 0 refills | Status: DC | PRN

## 2023-05-03 MED ORDER — KETOROLAC TROMETHAMINE 30 MG/ML IJ SOLN
INTRAMUSCULAR | Status: AC
  Filled 2023-05-03: qty 1

## 2023-05-03 NOTE — ED Provider Notes (Signed)
 [facility]    CSN: [id number] Arrival date & time: 05/03/23  1927      History   Chief Complaint Chief Complaint  Patient presents with   Back Pain    HPI Katrina Murphy is a 48 y.o. female.    Back Pain  Here for low back pain.  It had been bothering her on her right sacral area for a couple of weeks, off-and-on.  No recent trauma.  Then tonight she coughed after eating some spicy food and the back pain worsened suddenly and radiated over to her left low back.  No fever or dysuria or hematuria.   she is allergic to Augmentin, sulfa, doxycycline and spironolactone.   She has prednisone  in her med list, but she takes it occasionally for allergies and has not taken it in about 2 months.  She does have some meloxicam at home and did take the 15 mg dose of that earlier this morning.   old Flexeril that she had did not help today.   Past Medical History:  Diagnosis Date   Anxiety    Bacterial pneumonia 02/08/2014   Chronic bronchitis (HCC)    "got it q yr when I smoked" (02/16/2014)   Family history of anesthesia complication    father- severe vomiting    GERD (gastroesophageal reflux disease)    Hypertension    Kidney stones    Pneumonia 1980's X 1; 2000    Patient Active Problem List   Diagnosis Date Noted   Hypersomnolence 05/31/2021   OSA (obstructive sleep apnea) 12/10/2018   Exacerbation of reactive airway disease 02/18/2014   Pneumonia 02/16/2014   Kidney stones    Hypertension    Chronic bronchitis (HCC)    GERD (gastroesophageal reflux disease)    Anxiety     Past Surgical History:  Procedure Laterality Date   BACK SURGERY     CARPAL TUNNEL RELEASE Bilateral 2008   CYSTOSCOPY WITH RETROGRADE PYELOGRAM, URETEROSCOPY AND STENT PLACEMENT Left 12/19/2012   Procedure: CYSTOSCOPY WITH RETROGRADE PYELOGRAM, URETEROSCOPY AND STENT PLACEMENT;  Surgeon: Osborn Blaze, MD;  Location: [facility];  Service: Urology;  Laterality: Left;   CYSTOSCOPY  WITH STENT PLACEMENT  ~ 2004; ~ 2006; ~ 2010   "took all the stents out myself" (02/16/2014)   HOLMIUM LASER APPLICATION Left 12/19/2012   Procedure: HOLMIUM LASER APPLICATION;  Surgeon: Osborn Blaze, MD;  Location: [facility];  Service: Urology;  Laterality: Left;   LUMBAR DISC SURGERY  1992   "removed herniated pieces L4-5"   TONSILLECTOMY  2002   "one has grown back"   WISDOM TOOTH EXTRACTION      OB History   No obstetric history on file.      Home Medications    Prior to Admission medications   Medication Sig Start Date End Date Taking? Authorizing Provider  ketorolac  (TORADOL ) 10 MG tablet Take 1 tablet (10 mg total) by mouth every 6 (six) hours as needed (pain). 05/03/23  Yes Carnie Bruemmer K, MD  tiZANidine (ZANAFLEX) 4 MG tablet Take 1 tablet (4 mg total) by mouth every 8 (eight) hours as needed for muscle spasms. 05/03/23  Yes Ann Keto, MD  allopurinol  (ZYLOPRIM ) 100 MG tablet Take 100 mg by mouth daily. Patient takes in am    [provider]  azelastine (OPTIVAR) 0.05 % ophthalmic solution Apply 1 drop to eye daily as needed. Both eyes 12/08/18   [provider]  BIOTIN PO Take by mouth daily. Patient not taking:  Reported on 02/14/2022    [provider]  buPROPion (WELLBUTRIN XL) 300 MG 24 hr tablet Take 300 mg by mouth daily. 10/01/18   [provider]  Cholecalciferol (VITAMIN D-3 PO) Take by mouth daily.    [provider]  cyclobenzaprine (FLEXERIL) 10 MG tablet Take 10 mg by mouth 3 (three) times daily as needed for muscle spasms.    [provider]  diazepam (VALIUM) 5 MG tablet Take 5 mg by mouth 2 (two) times daily as needed.    [provider]  EPINEPHrine  0.3 mg/0.3 mL IJ SOAJ injection Inject into the muscle once. 12/08/18   [provider]  famotidine  (PEPCID ) 20 MG tablet Take 1 tablet (20 mg total) by mouth 2 (two) times daily. 03/10/22   Rancour, Mara Seminole, MD  fexofenadine (ALLEGRA) 180  MG tablet Take 180 mg by mouth daily.    [provider]  gabapentin (NEURONTIN) 300 MG capsule Take 300 mg by mouth 3 (three) times daily. 03/14/21   [provider]  gabapentin (NEURONTIN) 600 MG tablet gabapentin 600 mg tablet  Take 1 tablet every day by oral route at bedtime.    [provider]  hydrochlorothiazide (HYDRODIURIL) 25 MG tablet Take 25 mg by mouth daily. Patient takes in am    [provider]  lisinopril  (PRINIVIL ,ZESTRIL ) 10 MG tablet Take 10 mg by mouth daily. Patient takes in am    [provider]  meloxicam (MOBIC) 15 MG tablet Take 15 mg by mouth daily as needed. 06/20/18   [provider]  modafinil  (PROVIGIL ) 200 MG tablet TAKE 1 TABLET BY MOUTH TWICE  DAILY 04/29/23   Lind Repine, MD  Multiple Vitamin (MULTIVITAMIN) tablet Take 1 tablet by mouth daily.    [provider]  Norgestimate-Ethinyl Estradiol Triphasic 0.18/0.215/0.25 MG-35 MCG tablet Take 1 tablet by mouth daily.    [provider]  predniSONE  (DELTASONE ) 50 MG tablet 1 tablet PO daily 03/10/22   Rancour, Mara Seminole, MD  PROAIR  RESPICLICK 108 (90 Base) MCG/ACT AEPB Take 1 puff by mouth daily as needed. 12/08/18   [provider]  Semaglutide -Weight Management (WEGOVY ) 0.25 MG/0.5ML SOAJ Inject 0.25 mg into the skin once a week. 10/09/22     sertraline (ZOLOFT) 50 MG tablet Take 100 mg by mouth daily.     [provider]    Family History Family History  Problem Relation Age of Onset   Hypertension Mother    Hypertension Father    Heart attack Father    Hyperlipidemia Father     Social History Social History   Tobacco Use   Smoking status: Former    Current packs/day: 0.00    Average packs/day: 0.5 packs/day for 15.0 years (7.5 ttl pk-yrs)    Types: Cigarettes    Start date: 01/07/1994    Quit date: 01/07/2009    Years since quitting: 14.3   Smokeless tobacco: Never  Substance Use Topics   Alcohol use: Yes     Comment: 02/16/2014 "might drink 1 drink/month; if that"   Drug use: No     Allergies   Augmentin [amoxicillin-pot clavulanate], Sulfa antibiotics, Amoxicillin, Doxycycline, Sucralose, and Spironolactone   Review of Systems Review of Systems  Musculoskeletal:  Positive for back pain.     Physical Exam Triage Vital Signs ED Triage Vitals  Encounter Vitals Group     BP 05/03/23 1944 116/76     Systolic BP Percentile --      Diastolic BP Percentile --  Pulse Rate 05/03/23 1944 (!) 110     Resp 05/03/23 1944 (!) 22     Temp --      Temp src --      SpO2 05/03/23 1944 96 %     Weight --      Height --      Head Circumference --      Peak Flow --      Pain Score 05/03/23 1942 8     Pain Loc --      Pain Education --      Exclude from Growth Chart --    No data found.  Updated Vital Signs BP 116/76 (BP Location: Left Arm)   Pulse (!) 110   Resp (!) 22   LMP 04/26/2023 (Approximate)   SpO2 96%   Visual Acuity Right Eye Distance:   Left Eye Distance:   Bilateral Distance:    Right Eye Near:   Left Eye Near:    Bilateral Near:     Physical Exam Vitals reviewed.  Constitutional:      Appearance: She is not ill-appearing or diaphoretic.     Comments: In obvious discomfort  HENT:     Mouth/Throat:     Mouth: Mucous membranes are moist.  Eyes:     Extraocular Movements: Extraocular movements intact.     Conjunctiva/sclera: Conjunctivae normal.     Pupils: Pupils are equal, round, and reactive to light.  Cardiovascular:     Rate and Rhythm: Normal rate and regular rhythm.  Pulmonary:     Effort: Pulmonary effort is normal.     Breath sounds: Normal breath sounds.  Musculoskeletal:     Cervical back: Neck supple.     Comments: There is tenderness of the SI joint area bilaterally  Lymphadenopathy:     Cervical: No cervical adenopathy.  Skin:    Coloration: Skin is not jaundiced or pale.  Neurological:     Mental Status: She is alert and oriented to  person, place, and time.  Psychiatric:        Behavior: Behavior normal.      UC Treatments / Results  Labs (all labs ordered are listed, but only abnormal results are displayed) Labs Reviewed - No data to display  EKG   Radiology No results found.  Procedures Procedures (including critical care time)  Medications Ordered in UC Medications  ketorolac  (TORADOL ) 30 MG/ML injection 30 mg (30 mg Intramuscular Given 05/03/23 2005)    Initial Impression / Assessment and Plan / UC Course  I have reviewed the triage vital signs and the nursing notes.  Pertinent labs & imaging results that were available during my care of the patient were reviewed by me and considered in my medical decision making (see chart for details).      Toradol  is given here and tablets are sent to the pharmacy. Tizanidine sent in also. To the ER if not relieved at all, or if worsens Final Clinical Impressions(s) / UC Diagnoses   Final diagnoses:  Acute bilateral low back pain without sciatica     Discharge Instructions      You have been given a shot of Toradol  30 mg today.  Ketorolac  10 mg tablets--take 1 tablet every 6 hours as needed for pain.  This is the same medicine that is in the shot we just gave you   Take tizanidine 4 mg--1 every 8 hours as needed for muscle spasms; this medication can cause dizziness and sleepiness  ED Prescriptions     Medication Sig Dispense Auth. Provider   ketorolac  (TORADOL ) 10 MG tablet Take 1 tablet (10 mg total) by mouth every 6 (six) hours as needed (pain). 20 tablet Terrianna Holsclaw K, MD   tiZANidine (ZANAFLEX) 4 MG tablet Take 1 tablet (4 mg total) by mouth every 8 (eight) hours as needed for muscle spasms. 15 tablet Delbra Zellars, Paige Boatman, MD      I have reviewed the PDMP during this encounter.   Ann Keto, MD 05/03/23 2013

## 2023-05-03 NOTE — Discharge Instructions (Signed)
You have been given a shot of Toradol 30 mg today.  Ketorolac 10 mg tablets--take 1 tablet every 6 hours as needed for pain.  This is the same medicine that is in the shot we just gave you   Take tizanidine 4 mg--1 every 8 hours as needed for muscle spasms; this medication can cause dizziness and sleepiness

## 2023-05-03 NOTE — ED Triage Notes (Signed)
 Pt c/o back pain for a couple weeks that got worse last night when choked on spicy food and coughed. Taken meloxicam and FLexeril today and yesterday.

## 2023-05-16 ENCOUNTER — Ambulatory Visit (HOSPITAL_BASED_OUTPATIENT_CLINIC_OR_DEPARTMENT_OTHER): Payer: 59 | Admitting: Pulmonary Disease

## 2023-05-16 ENCOUNTER — Encounter (HOSPITAL_BASED_OUTPATIENT_CLINIC_OR_DEPARTMENT_OTHER): Payer: Self-pay | Admitting: Pulmonary Disease

## 2023-05-16 VITALS — BP 158/88 | HR 84 | Ht 62.0 in | Wt 316.3 lb

## 2023-05-16 DIAGNOSIS — R400 Somnolence: Secondary | ICD-10-CM | POA: Diagnosis not present

## 2023-05-16 DIAGNOSIS — Z6841 Body Mass Index (BMI) 40.0 and over, adult: Secondary | ICD-10-CM | POA: Diagnosis not present

## 2023-05-16 DIAGNOSIS — G4733 Obstructive sleep apnea (adult) (pediatric): Secondary | ICD-10-CM | POA: Diagnosis not present

## 2023-05-16 NOTE — Progress Notes (Signed)
   Subjective:    Patient ID: Katrina Murphy, female    DOB: [birth year], 48 y.o.   MRN: [id number]  HPI  48 yo for follow-up of OSA and hypersomnolence  in spite of good compliance with CPAP.   -on gabapentin off label for anxiety 05/2021 >> started on modafinil   annual follow-up.  She is maintained on modafinil  for hypersomnolence and recently started Vyvanse for ADD, which has significantly improved her symptoms. She experiences dry mouth as a side effect of Vyvanse. She uses a CPAP machine consistently for her obstructive sleep apnea with good results. The machine is several years old but functions well, and she is satisfied with its performance. She has a history of morbid obesity and previously used Wegovy , which was discontinued due to insurance coverage issues.  DIAGNOSTIC CPAP usage: Pressure 8-13 cm H2O, low number of events, good mask seal, excellent compliance  Significant tests/ events reviewed   MSLT 10/10/21 >> sleep latency 4.6 mins, 0 SOREMs  CPAP ttiration 10/09/21 >> 14 cm, medium AirFit N20 04/2019 HST >> AHI 16/h  Review of Systems neg for any significant sore throat, dysphagia, itching, sneezing, nasal congestion or excess/ purulent secretions, fever, chills, sweats, unintended wt loss, pleuritic or exertional cp, hempoptysis, orthopnea pnd or change in chronic leg swelling. Also denies presyncope, palpitations, heartburn, abdominal pain, nausea, vomiting, diarrhea or change in bowel or urinary habits, dysuria,hematuria, rash, arthralgias, visual complaints, headache, numbness weakness or ataxia.     Objective:   Physical Exam  Gen. Pleasant, obese, in no distress ENT - no lesions, no post nasal drip Neck: No JVD, no thyromegaly, no carotid bruits Lungs: no use of accessory muscles, no dullness to percussion, decreased without rales or rhonchi  Cardiovascular: Rhythm regular, heart sounds  normal, no murmurs or gallops, no peripheral edema Musculoskeletal: No  deformities, no cyanosis or clubbing , no tremors       Assessment & Plan:   Obstructive Sleep Apnea (OSA) OSA is well-managed with CPAP therapy. She reports consistent use of the CPAP machine with effective pressure settings and a good mask seal, resulting in a low number of apnea events. - Continue CPAP therapy with pressure settings at 8 to 13 cm H2O. - Schedule annual follow-up to reassess OSA management.  Residual Hypersomnolence Residual hypersomnolence previously managed with modafinil . Vyvanse, initiated for ADD, has significantly improved hypersomnolence symptoms. Concurrent use of modafinil  and Vyvanse is not recommended due to potential cardiac risks. She reports dry mouth as a side effect and monitors her blood pressure. - Discontinue modafinil . - Monitor for Vyvanse side effects, including dry mouth and potential cardiac issues such as palpitations and increased blood pressure.  Morbid Obesity Management is complicated by insurance coverage issues for weight loss medications like Wegovy . She cannot afford Wegovy  due to high out-of-pocket costs, despite potential insurance coverage for sleep apnea-related prescriptions. Future insurance coverage changes may occur. - Explore potential insurance coverage changes for weight loss medications related to sleep apnea diagnosis.

## 2023-05-20 ENCOUNTER — Ambulatory Visit: Payer: 59 | Admitting: Family Medicine

## 2023-05-24 ENCOUNTER — Ambulatory Visit: Payer: Self-pay

## 2023-05-24 NOTE — Telephone Encounter (Signed)
 Copied from CRM [id number]. Topic: Clinical - Red Word Triage >> May 24, 2023 10:22 AM Chuck Crater wrote: Red Word that prompted transfer to Nurse Triage: Patient is having back pain and numbness in her right foot and toe and cramping pain on her calf and thigh.   Chief Complaint: Back Pain/Establishment of Care Symptoms: back pain---chronic issue with acute pain starting a month ago Frequency: x 1 month Pertinent Negatives: Patient denies fever, abdomen pain, burning with urination, blood in urine, problems with bowel/bladder control Disposition: [] ED /[] Urgent Care (no appt availability in office) / [] Appointment(In office/virtual)/ []  [place] [facility]/ [] Home Care/ [x] Refused Recommended Disposition /[] [place] [facility]/ []  Follow-up with PCP Additional Notes: Patient called and advised that for the past month she has been having issues  2-3 weeks over she states that she over did it and she stood to cough one day and her lower back has been hurting ever since. She went to Urgent Care after that.  Numbness in right toes and on top of right foot. Patient went to Urgent Care this week. History of back surgeries. Patient is two days from ending her second round of Prednisone . Patient taking flexeril for her calf cramping and thigh pain in her right leg. She has had drop foot. Patient has [place] doing physical [place]. Patient has a standing prescription for Flexeril and Meloxicam due to having back surgery as a child. Patient denies fever, abdomen pain, burning with urination, blood in urine, problems with bowel/bladder control.  June 7th patient will be out of state until the end of September.  Patient is currently at the [facility] and has a New Patient Human resources officer of Care appointment for May 27th.  Patient wanted to make sure that she could also be seen for this acute issue with her back after establishment of care before June 7th.  She is advised that  the recommendation per protocol is to be seen in 24 hours by a PCP but with her being out of town, not a patient yet, she states she just wanted the office to know ahead of time what was going on with her so she could hopefully get the back pain addressed prior to going out of town for three months. Patient is advised that the initial visit will be to establish care. She is advised that she can go to Urgent Care but she states that she has gone as far as they can when going to Urgent Care. Patient is advised that if anything gets worse to go to the nearest Emergency Room. Patient verbalized understanding and states she will be at her already scheduled appointment May 27th Tuesday to establish care.   Reason for Disposition  Numbness in a leg or foot (i.e., loss of sensation)  Answer Assessment - Initial Assessment Questions 1. ONSET: "When did the pain begin?"      X 1 month 2. LOCATION: "Where does it hurt?" (upper, mid or lower back)     Lower back and down the right leg 3. SEVERITY: "How bad is the pain?"  (e.g., Scale 1-10; mild, moderate, or severe)   - MILD (1-3): Doesn't interfere with normal activities.    - MODERATE (4-7): Interferes with normal activities or awakens from sleep.    - SEVERE (8-10): Excruciating pain, unable to do any normal activities.      2 4. PATTERN: "Is the pain constant?" (e.g., yes, no; constant, intermittent)      Off  and on cramping of back of right thigh and right calf 5. RADIATION: "Does the pain shoot into your legs or somewhere else?"     Down right leg 6. CAUSE:  "What do you think is causing the back pain?"      Previous history and use 7. BACK OVERUSE:  "Any recent lifting of heavy objects, strenuous work or exercise?"     Daily use 8. MEDICINES: "What have you taken so far for the pain?" (e.g., nothing, acetaminophen , NSAIDS)     Flexeril, Prednisone  9. NEUROLOGIC SYMPTOMS: "Do you have any weakness, numbness, or problems with bowel/bladder  control?"     Numbness in right toes and top of right foot May 3rd 10. OTHER SYMPTOMS: "Do you have any other symptoms?" (e.g., fever, abdomen pain, burning with urination, blood in urine)       No 11. PREGNANCY: "Is there any chance you are pregnant?" "When was your last menstrual period?"       No  Protocols used: Back Pain-A-AH

## 2023-05-28 ENCOUNTER — Ambulatory Visit: Payer: 59 | Admitting: Family Medicine

## 2023-05-30 ENCOUNTER — Ambulatory Visit (INDEPENDENT_AMBULATORY_CARE_PROVIDER_SITE_OTHER): Admitting: Student in an Organized Health Care Education/Training Program

## 2023-05-30 ENCOUNTER — Encounter: Payer: Self-pay | Admitting: Student in an Organized Health Care Education/Training Program

## 2023-05-30 VITALS — BP 112/74 | HR 104 | Ht 63.58 in | Wt 311.0 lb

## 2023-05-30 DIAGNOSIS — E782 Mixed hyperlipidemia: Secondary | ICD-10-CM | POA: Insufficient documentation

## 2023-05-30 DIAGNOSIS — I10 Essential (primary) hypertension: Secondary | ICD-10-CM

## 2023-05-30 DIAGNOSIS — J309 Allergic rhinitis, unspecified: Secondary | ICD-10-CM | POA: Insufficient documentation

## 2023-05-30 DIAGNOSIS — R41840 Attention and concentration deficit: Secondary | ICD-10-CM

## 2023-05-30 DIAGNOSIS — J4520 Mild intermittent asthma, uncomplicated: Secondary | ICD-10-CM | POA: Insufficient documentation

## 2023-05-30 DIAGNOSIS — Z6841 Body Mass Index (BMI) 40.0 and over, adult: Secondary | ICD-10-CM

## 2023-05-30 DIAGNOSIS — E66813 Obesity, class 3: Secondary | ICD-10-CM

## 2023-05-30 DIAGNOSIS — M5416 Radiculopathy, lumbar region: Secondary | ICD-10-CM | POA: Insufficient documentation

## 2023-05-30 DIAGNOSIS — E282 Polycystic ovarian syndrome: Secondary | ICD-10-CM | POA: Insufficient documentation

## 2023-05-30 DIAGNOSIS — K219 Gastro-esophageal reflux disease without esophagitis: Secondary | ICD-10-CM

## 2023-05-30 DIAGNOSIS — R7303 Prediabetes: Secondary | ICD-10-CM | POA: Diagnosis not present

## 2023-05-30 DIAGNOSIS — N200 Calculus of kidney: Secondary | ICD-10-CM | POA: Diagnosis not present

## 2023-05-30 DIAGNOSIS — E790 Hyperuricemia without signs of inflammatory arthritis and tophaceous disease: Secondary | ICD-10-CM

## 2023-05-30 DIAGNOSIS — M545 Low back pain, unspecified: Secondary | ICD-10-CM | POA: Insufficient documentation

## 2023-05-30 DIAGNOSIS — G4733 Obstructive sleep apnea (adult) (pediatric): Secondary | ICD-10-CM

## 2023-05-30 LAB — BASIC METABOLIC PANEL WITH GFR
BUN: 17 mg/dL (ref 6–23)
CO2: 30 meq/L (ref 19–32)
Calcium: 10.1 mg/dL (ref 8.4–10.5)
Chloride: 94 meq/L — ABNORMAL LOW (ref 96–112)
Creatinine, Ser: 0.95 mg/dL (ref 0.40–1.20)
GFR: 71.34 mL/min (ref >60.00)
Glucose, Bld: 163 mg/dL — ABNORMAL HIGH (ref 70–99)
Potassium: 4.3 meq/L (ref 3.5–5.1)
Sodium: 135 meq/L (ref 135–145)

## 2023-05-30 LAB — LIPID PANEL
Cholesterol: 187 mg/dL (ref 0–200)
HDL: 57.6 mg/dL (ref >39.00)
LDL Cholesterol: 90 mg/dL (ref 0–99)
NonHDL: 129.19
Total CHOL/HDL Ratio: 3
Triglycerides: 198 mg/dL — ABNORMAL HIGH (ref 0.0–149.0)
VLDL: 39.6 mg/dL (ref 0.0–40.0)

## 2023-05-30 LAB — CBC
HCT: 48.5 % — ABNORMAL HIGH (ref 36.0–46.0)
Hemoglobin: 15.9 g/dL — ABNORMAL HIGH (ref 12.0–15.0)
MCHC: 32.7 g/dL (ref 30.0–36.0)
MCV: 88.8 fl (ref 78.0–100.0)
Platelets: 359 10*3/uL (ref 150.0–400.0)
RBC: 5.46 Mil/uL — ABNORMAL HIGH (ref 3.87–5.11)
RDW: 15 % (ref 11.5–15.5)
WBC: 12.8 10*3/uL — ABNORMAL HIGH (ref 4.0–10.5)

## 2023-05-30 LAB — HEMOGLOBIN A1C: Hgb A1c MFr Bld: 6 % (ref 4.6–6.5)

## 2023-05-30 LAB — URIC ACID: Uric Acid, Serum: 4.2 mg/dL (ref 2.4–7.0)

## 2023-05-30 LAB — TSH: TSH: 3.75 u[IU]/mL (ref 0.35–5.50)

## 2023-05-30 MED ORDER — HYDROCHLOROTHIAZIDE 25 MG PO TABS: 25.0000 mg | ORAL_TABLET | Freq: Every day | ORAL | 1 refills | Status: AC

## 2023-05-30 MED ORDER — FAMOTIDINE 20 MG PO TABS: 20.0000 mg | ORAL_TABLET | Freq: Two times a day (BID) | ORAL | 0 refills | Status: DC

## 2023-05-30 MED ORDER — ALLOPURINOL 100 MG PO TABS: 100.0000 mg | ORAL_TABLET | Freq: Every day | ORAL | 1 refills | Status: DC

## 2023-05-30 MED ORDER — LISINOPRIL 10 MG PO TABS: 10.0000 mg | ORAL_TABLET | Freq: Every day | ORAL | 1 refills | Status: DC

## 2023-05-30 NOTE — Assessment & Plan Note (Signed)
 Managed by a psychiatrist in the [place] area.  Recently started on Vyvanse, having good benefits.  No complications so far.  No history of cardiovascular disease, does have hypertension which is well-controlled.  Will check lipids today.

## 2023-05-30 NOTE — Progress Notes (Signed)
 New Patient Office Visit  Subjective    Patient ID: Katrina Murphy, female    DOB: 21-Jan-1975  Age: 48 y.o. MRN: [id number]  CC:  Chief Complaint  Patient presents with   Establish Care    1 month ago patient started having SI joint pain on right side, has been taking flexeril and meloxicam. Can not feel 3 toes or top of foot but has done 3 weeks of extensive PT and does have some strength back in legs. Surgery on L4 &5 at the age of 27.     HPI  Katrina Murphy presents to establish care  48 year old person here for management of hypertension and low back pain.  She previously had a primary care physician at [facility] in [place] who has now retired.  Patient works for [employer], works remotely.  She has a number of chronic medical conditions and subspecialists helping to manage diabetes.  She has anxiety and ADHD, recently established with a psychiatrist in the [place] area who has changed many of her medications and starting Vyvanse.  This has been a very beneficial change for her, concentration is much better, somnolence is resolved, doing better at work.  She has severe obstructive sleep apnea which is managed with a pulmonologist, doing very well on CPAP.  She had an episode of acute low back pain, history of low back surgery at the age of 48.  This was managed with [facility] recently, had a course of prednisone  and the low back pain has steadily improved.  She still has some residual sensation of paresthesias in the right leg, had transient weakness in the right foot but this is now resolved and she is walking normally again.  She has reactive airway disease, mild intermittent asthma, allergic rhinitis, and this is managed with desensitization shots with allergy and immunology.  Patient has severe obesity.  This was being managed with her primary care physician.  Wegovy  was ordered at 1 point, but it was not covered by her insurance and so not obtainable.  She does reports a  significant weight loss recently since starting Vyvanse.  She tells me that she mostly does not eat during the day, only eats at dinnertime, she is worried she is not getting enough protein recently since starting the Vyvanse, and says that she has been at times forcing herself to eat.  Patient spends about 6 months out of the year in [place]  caring for her elderly mother.    Past Medical History:  Diagnosis Date   Allergy 2016   Allergy Shot patient   Anxiety    Arthritis    Asthma 2016   Bacterial pneumonia 02/08/2014   Chronic bronchitis (HCC)    "got it q yr when I smoked" (02/16/2014)   Family history of anesthesia complication    father- severe vomiting    GERD (gastroesophageal reflux disease)    Hypertension    Kidney stones    Pneumonia 1980's X 1; 2000   Sleep apnea 2021    Past Surgical History:  Procedure Laterality Date   BACK SURGERY     CARPAL TUNNEL RELEASE Bilateral 01/01/2006   CYSTOSCOPY WITH RETROGRADE PYELOGRAM, URETEROSCOPY AND STENT PLACEMENT Left 12/19/2012   Procedure: CYSTOSCOPY WITH RETROGRADE PYELOGRAM, URETEROSCOPY AND STENT PLACEMENT;  Surgeon: Osborn Blaze, MD;  Location: [facility];  Service: Urology;  Laterality: Left;   CYSTOSCOPY WITH STENT PLACEMENT  ~ 2004; ~ 2006; ~ 2010   "took all the stents out myself" (02/16/2014)  HOLMIUM LASER APPLICATION Left 12/19/2012   Procedure: HOLMIUM LASER APPLICATION;  Surgeon: Osborn Blaze, MD;  Location: [facility];  Service: Urology;  Laterality: Left;   LUMBAR DISC SURGERY  01/01/1990   "removed herniated pieces L4-5"   SPINE SURGERY  1992   TONSILLECTOMY  01/02/2000   "one has grown back"   WISDOM TOOTH EXTRACTION      Family History  Problem Relation Age of Onset   Hypertension Mother    Arthritis Mother    Asthma Mother    Hyperlipidemia Mother    Hypertension Father    Heart attack Father    Hyperlipidemia Father    Arthritis Father    Cancer Father    Hearing loss Father    Heart  disease Father    Obesity Father    Cancer Maternal Grandmother    COPD Maternal Grandmother    Diabetes Maternal Grandmother    Vision loss Maternal Grandmother    Diabetes Maternal Aunt    Diabetes Maternal Aunt    Stroke Maternal Aunt    Hyperlipidemia Paternal Aunt    Diabetes Maternal Aunt    Diabetes Maternal Aunt    Stroke Maternal Aunt    Hyperlipidemia Paternal Aunt     Social History   Socioeconomic History   Marital status: Single    Spouse name: Not on file   Number of children: Not on file   Years of education: Not on file   Highest education level: Bachelor's degree (e.g., BA, AB, BS)  Occupational History   Not on file  Tobacco Use   Smoking status: Former    Current packs/day: 0.00    Average packs/day: 0.5 packs/day for 15.0 years (7.5 ttl pk-yrs)    Types: Cigarettes    Start date: 01/07/1994    Quit date: 01/07/2009    Years since quitting: 14.4   Smokeless tobacco: Never  Substance and Sexual Activity   Alcohol use: Not Currently    Comment: 02/16/2014 "might drink 1 drink/month; if that"   Drug use: No   Sexual activity: Not Currently    Birth control/protection: Pill  Other Topics Concern   Not on file  Social History Narrative   Not on file   Social Drivers of Health   Financial Resource Strain: Medium Risk (05/28/2023)   Overall Financial Resource Strain (CARDIA)    Difficulty of Paying Living Expenses: Somewhat hard  Food Insecurity: No Food Insecurity (05/28/2023)   Hunger Vital Sign    Worried About Running Out of Food in the Last Year: Never true    Ran Out of Food in the Last Year: Never true  Transportation Needs: No Transportation Needs (05/28/2023)   PRAPARE - Administrator, Civil Service (Medical): No    Lack of Transportation (Non-Medical): No  Physical Activity: Insufficiently Active (05/28/2023)   Exercise Vital Sign    Days of Exercise per Week: 1 day    Minutes of Exercise per Session: 30 min  Stress: No Stress  Concern Present (05/28/2023)   [employer] of Occupational Health - Occupational Stress Questionnaire    Feeling of Stress : Only a little  Social Connections: Moderately Isolated (05/28/2023)   Social Connection and Isolation Panel [NHANES]    Frequency of Communication with Friends and Family: More than three times a week    Frequency of Social Gatherings with Friends and Family: Once a week    Attends Religious Services: Never    Database administrator or Organizations:  Yes    Attends Club or Organization Meetings: 1 to 4 times per year    Marital Status: Never married  Intimate Partner Violence: Not on file        Objective    BP 112/74   Pulse (!) 104   Ht 5' 3.58" (1.615 m)   Wt (!) 311 lb (141.1 kg)   LMP 04/26/2023 (Approximate)   SpO2 98%   BMI 54.09 kg/m   Physical Exam  Gen: Well-appearing, diaphoretic Eyes: Normal Ears: Normal tympanic membranes Neck: Normal thyroid, no nodules or adenopathy Heart: Regular, no murmur Lungs: Clear to station bilaterally Abd: Obese, nontender Ext: Warm, nonpitting edema bilaterally Psych: Appropriate mood and affect, linear speech, organized thoughts Neuro: Alert, conversational, full strength upper and lower extremities, normal get up and go, wide-based gait, full strength in both lower extremities.  Normal patellar reflexes bilaterally.  Normal sensation in both feet with monofilament.     Assessment & Plan:   Problem List Items Addressed This Visit       High   Hypertension (Chronic)   Chronic and stable.  Blood pressure well-controlled today.  Plan to continue hydrochlorothiazide and lisinopril .  No adverse side effects to lisinopril  in the past.  Is on very low-dose.  Plan to check labs today.      Relevant Medications   hydrochlorothiazide (HYDRODIURIL) 25 MG tablet   lisinopril  (ZESTRIL ) 10 MG tablet   Other Relevant Orders   Basic metabolic panel with GFR   ADD (attention deficit disorder) (Chronic)    Managed by a psychiatrist in the [place] area.  Recently started on Vyvanse, having good benefits.  No complications so far.  No history of cardiovascular disease, does have hypertension which is well-controlled.  Will check lipids today.      Class 3 severe obesity with body mass index (BMI) of 50.0 to 59.9 in adult - Primary (Chronic)   Chronic and improving.  Weight today is 311 pounds with a BMI of 54.  Patient reports a recent 30 pound weight loss after starting Vyvanse for ADHD.  She is having some complications of obesity, medical record lists a history of hyperlipidemia and prediabetes.  She is also having intermittent episodes of acute low back pain which is probably due to low back arthritis with nerve impingement.  We talked about nutrition, talked about tools to help with calorie counting and reasonable goals.  Based on her age and weight, I calculate her total energy expenditure's each day to be about 2500 cal.  She has considered GLP-1 agonist medication assisted treatment in the past, but it is not covered by her insurance.  We talked about the Zepbound vial option, patient declines for now as they are also too expensive.  Hopefully something will change with her insurance coverage in the future to allow her to access these medications which I think would be beneficial for her overall health, quality of life, and function.      Relevant Orders   TSH     Medium    Hyperuricemia (Chronic)   History of nephrolithiasis due to hyperuricemia.  She is on treatment with allopurinol  and hydrochlorothiazide to reduce the risk of future kidney stones.  Will check uric acid levels today along with a CBC for safety monitoring of the allopurinol .      Relevant Medications   allopurinol  (ZYLOPRIM ) 100 MG tablet   hydrochlorothiazide (HYDRODIURIL) 25 MG tablet   OSA (obstructive sleep apnea) (Chronic)   Chronic and stable.  Managed with Dr. Villa Greaser.  Doing very well on CPAP.  She was having some  difficulty with hypersomnolence despite good adherence with the CPAP machine.  This hypersomnolence has now resolved since starting Vyvanse for treatment of ADHD.      Mixed hyperlipidemia (Chronic)   Relevant Medications   hydrochlorothiazide  (HYDRODIURIL ) 25 MG tablet   lisinopril  (ZESTRIL ) 10 MG tablet   Other Relevant Orders   Lipid panel   Pre-diabetes (Chronic)   Relevant Orders   Hemoglobin A1c     Low   GERD (gastroesophageal reflux disease) (Chronic)   Relevant Medications   famotidine  (PEPCID ) 20 MG tablet   Acute radicular low back pain   Recent episode of acute low back pain is the patient's largest concern today.  Thankfully her low back pain has resolved.  She is still having some residual paresthesias down the right leg, but the radiculopathy is also improving.  She has normal strength in the right leg, and normal sensation on monofilament testing.  We talked about the expected nature of acute low back pain.  We talked about setting reasonable expectations.  I do not think she needs to use Flexeril anymore as the discomfort has resolved.  She continues to use gabapentin which is fine.  We talked about the importance of limiting exposure to prednisone  in the future.  Overall I think low back pain is going to be an intermittent issue for her, and I recommended weight loss as the best option for reducing the risk and frequency of future low back pain episodes.       Return in about 6 months (around 11/30/2023).   Ether Hercules, MD

## 2023-05-30 NOTE — Assessment & Plan Note (Signed)
 Chronic and improving.  Weight today is 311 pounds with a BMI of 54.  Patient reports a recent 30 pound weight loss after starting Vyvanse for ADHD.  She is having some complications of obesity, medical record lists a history of hyperlipidemia and prediabetes.  She is also having intermittent episodes of acute low back pain which is probably due to low back arthritis with nerve impingement.  We talked about nutrition, talked about tools to help with calorie counting and reasonable goals.  Based on her age and weight, I calculate her total energy expenditure's each day to be about 2500 cal.  She has considered GLP-1 agonist medication assisted treatment in the past, but it is not covered by her insurance.  We talked about the Zepbound vial option, patient declines for now as they are also too expensive.  Hopefully something will change with her insurance coverage in the future to allow her to access these medications which I think would be beneficial for her overall health, quality of life, and function.

## 2023-05-30 NOTE — Assessment & Plan Note (Signed)
 History of nephrolithiasis due to hyperuricemia.  She is on treatment with allopurinol  and hydrochlorothiazide to reduce the risk of future kidney stones.  Will check uric acid levels today along with a CBC for safety monitoring of the allopurinol .

## 2023-05-30 NOTE — Assessment & Plan Note (Signed)
 Recent episode of acute low back pain is the patient's largest concern today.  Thankfully her low back pain has resolved.  She is still having some residual paresthesias down the right leg, but the radiculopathy is also improving.  She has normal strength in the right leg, and normal sensation on monofilament testing.  We talked about the expected nature of acute low back pain.  We talked about setting reasonable expectations.  I do not think she needs to use Flexeril anymore as the discomfort has resolved.  She continues to use gabapentin which is fine.  We talked about the importance of limiting exposure to prednisone  in the future.  Overall I think low back pain is going to be an intermittent issue for her, and I recommended weight loss as the best option for reducing the risk and frequency of future low back pain episodes.

## 2023-05-30 NOTE — Assessment & Plan Note (Signed)
 Chronic and stable.  Managed with Dr. Villa Greaser.  Doing very well on CPAP.  She was having some difficulty with hypersomnolence despite good adherence with the CPAP machine.  This hypersomnolence has now resolved since starting Vyvanse for treatment of ADHD.

## 2023-05-30 NOTE — Assessment & Plan Note (Signed)
 Chronic and stable.  Blood pressure well-controlled today.  Plan to continue hydrochlorothiazide and lisinopril .  No adverse side effects to lisinopril  in the past.  Is on very low-dose.  Plan to check labs today.

## 2023-05-31 ENCOUNTER — Ambulatory Visit: Payer: Self-pay | Admitting: Student in an Organized Health Care Education/Training Program

## 2023-06-06 ENCOUNTER — Encounter (HOSPITAL_BASED_OUTPATIENT_CLINIC_OR_DEPARTMENT_OTHER): Payer: Self-pay | Admitting: Pulmonary Disease

## 2023-06-06 NOTE — Telephone Encounter (Signed)
**Note De-identified  [place] Obfuscation** Please advise 

## 2023-06-07 MED ORDER — MODAFINIL 200 MG PO TABS: 200.0000 mg | ORAL_TABLET | Freq: Every day | ORAL | 0 refills | Status: AC | PRN

## 2023-06-07 NOTE — Telephone Encounter (Signed)
Can you send rx for pt

## 2023-06-07 NOTE — Telephone Encounter (Signed)
 Refill x1 sent She should try to cut down on gabapentin & anti depressant  - which can be sedating too

## 2023-06-25 ENCOUNTER — Other Ambulatory Visit: Payer: Self-pay | Admitting: Student in an Organized Health Care Education/Training Program

## 2023-06-25 DIAGNOSIS — K219 Gastro-esophageal reflux disease without esophagitis: Secondary | ICD-10-CM

## 2023-06-25 NOTE — Telephone Encounter (Unsigned)
 Copied from CRM [id number]. Topic: Clinical - Medication Refill >> Jun 25, 2023  3:31 PM Suzen RAMAN wrote: Medication: famotidine  (PEPCID ) 20 MG tablet (PATIENT WOULD LIKE A 90 DAY SUPPLY)  Has the patient contacted their pharmacy? Yes (Agent: If no, request that the patient contact the pharmacy for the refill. If patient does not wish to contact the pharmacy document the reason why and proceed with request.) (Agent: If yes, when and what did the pharmacy advise?)  This is the patient's preferred pharmacy:   [facility] [place] [place] - [address] [place] [address] Phone: [phone] Fax: [phone]  Is this the correct pharmacy for this prescription? Yes If no, delete pharmacy and type the correct one.   Has the prescription been filled recently? No  Is the patient out of the medication? Yes  Has the patient been seen for an appointment in the last year OR does the patient have an upcoming appointment? Yes  Can we respond through MyChart? Yes  Agent: Please be advised that Rx refills may take up to 3 business days. We ask that you follow-up with your pharmacy.

## 2023-06-26 MED ORDER — FAMOTIDINE 20 MG PO TABS: 20.0000 mg | ORAL_TABLET | Freq: Every day | ORAL | 1 refills | Status: DC

## 2023-06-26 NOTE — Telephone Encounter (Signed)
 Quantity reflects a short prescription, should she continue this.

## 2023-08-29 ENCOUNTER — Encounter: Payer: Self-pay | Admitting: Student in an Organized Health Care Education/Training Program

## 2023-11-03 ENCOUNTER — Other Ambulatory Visit: Payer: Self-pay

## 2023-11-03 ENCOUNTER — Ambulatory Visit (HOSPITAL_COMMUNITY)
Admission: EM | Admit: 2023-11-03 | Discharge: 2023-11-03 | Disposition: A | Discharge status: Home / Self Care | Source: Ambulatory Visit | Attending: Physician Assistant | Admitting: Physician Assistant

## 2023-11-03 VITALS — BP 146/86 | HR 94 | Temp 97.8°F | Resp 18

## 2023-11-03 DIAGNOSIS — M5441 Lumbago with sciatica, right side: Secondary | ICD-10-CM | POA: Diagnosis not present

## 2023-11-03 MED ORDER — KETOROLAC TROMETHAMINE 30 MG/ML IJ SOLN
30.0000 mg | Freq: Once | INTRAMUSCULAR | Status: AC
  Administered 2023-11-03: 30 mg via INTRAMUSCULAR

## 2023-11-03 MED ORDER — HYDROCODONE-ACETAMINOPHEN 5-325 MG PO TABS: 2.0000 | ORAL_TABLET | Freq: Four times a day (QID) | ORAL | 0 refills | Status: AC | PRN

## 2023-11-03 NOTE — ED Triage Notes (Signed)
 Pt sts lower back and right sided sciatica pain x 2 weeks since returning from a trip and worse over last 2 days; pt sts known herniated disc and toe numbness to right side supposed to have procedure this week at [facility] for same

## 2023-11-03 NOTE — ED Provider Notes (Signed)
 [facility]    CSN: [id number] Arrival date & time: 11/03/23  1101      History   Chief Complaint Chief Complaint  Patient presents with   Back Pain    HPI Katrina Murphy is a 48 y.o. female.  has a past medical history of Allergy (2016), Anxiety, Arthritis, Asthma (2016), Bacterial pneumonia (02/08/2014), Chronic bronchitis (HCC), Family history of anesthesia complication, GERD (gastroesophageal reflux disease), Hypertension, Kidney stones, Pneumonia (1980's X 1; 2000), and Sleep apnea (2021).   HPI  Discussed the use of AI scribe software for clinical note transcription with the patient, who gave verbal consent to proceed.  The patient, with a history of herniated disc at L4 and L5, presents with low back pain and sciatica.  The patient has been experiencing severe low back pain and sciatica, particularly affecting the right buttock, back of the thigh, and lower back, described as 'Charlie horse pain down the leg.' The pain has intensified over the past two days, with a particularly difficult night recently. She suspects the pain may have been exacerbated by moving a mattress.  There is a history of surgery on the same site as a child. She has been experiencing numbness in the foot, which has been persistent over the summer, and now reports that the numbness has expanded to the side of the calf. She also notes a worsening of drop foot and weakness in the right leg, which she rates as a four out of five in strength compared to the left leg.  For pain management, she has taken Flexeril and Toradol  pills, which provided some relief, but Advil is no longer effective. She has not taken any Toradol  since 3 AM today and expresses a desire to avoid strong pain medications due to work commitments. She has not used Valium recently but has it available as needed.  She has resumed yoga recently to regain core strength, which was lost over the summer, but is cautious about  aggravating her condition. She finds it painful to perform certain stretches and notes difficulty with daily activities such as feeding cats and doing laundry due to the pain.  She mentions a history of constipation and notes that sitting on the toilet exacerbates the pain in the right leg. She denies loss of control of bowel or bladder function.   Past Medical History:  Diagnosis Date   Allergy 2016   Allergy Shot patient   Anxiety    Arthritis    Asthma 2016   Bacterial pneumonia 02/08/2014   Chronic bronchitis (HCC)    got it q yr when I smoked (02/16/2014)   Family history of anesthesia complication    father- severe vomiting    GERD (gastroesophageal reflux disease)    Hypertension    Kidney stones    Pneumonia 1980's X 1; 2000   Sleep apnea 2021    Patient Active Problem List   Diagnosis Date Noted   Allergic rhinitis 05/30/2023   Acute radicular low back pain 05/30/2023   Mild intermittent asthma 05/30/2023   Mixed hyperlipidemia 05/30/2023   Class 3 severe obesity with body mass index (BMI) of 50.0 to 59.9 in adult [facility]) 05/30/2023   Polycystic ovary syndrome 05/30/2023   Pre-diabetes 05/30/2023   ADD (attention deficit disorder) 04/01/2023   OSA (obstructive sleep apnea) 12/10/2018   Hyperuricemia    Hypertension    GERD (gastroesophageal reflux disease)    Anxiety     Past Surgical History:  Procedure Laterality Date  BACK SURGERY     CARPAL TUNNEL RELEASE Bilateral 01/01/2006   CYSTOSCOPY WITH RETROGRADE PYELOGRAM, URETEROSCOPY AND STENT PLACEMENT Left 12/19/2012   Procedure: CYSTOSCOPY WITH RETROGRADE PYELOGRAM, URETEROSCOPY AND STENT PLACEMENT;  Surgeon: Ricardo Likens, MD;  Location: [facility];  Service: Urology;  Laterality: Left;   CYSTOSCOPY WITH STENT PLACEMENT  ~ 2004; ~ 2006; ~ 2010   took all the stents out myself (02/16/2014)   HOLMIUM LASER APPLICATION Left 12/19/2012   Procedure: HOLMIUM LASER APPLICATION;  Surgeon: Ricardo Likens, MD;   Location: [facility];  Service: Urology;  Laterality: Left;   LUMBAR DISC SURGERY  01/01/1990   removed herniated pieces L4-5   SPINE SURGERY  1992   TONSILLECTOMY  01/02/2000   one has grown back   WISDOM TOOTH EXTRACTION      OB History   No obstetric history on file.      Home Medications    Prior to Admission medications   Medication Sig Start Date End Date Taking? Authorizing Provider  HYDROcodone -acetaminophen  (NORCO/VICODIN) 5-325 MG tablet Take 2 tablets by mouth every 6 (six) hours as needed for up to 5 days for severe pain (pain score 7-10). 11/03/23 11/08/23 Yes Makya Yurko E, PA-C  allopurinol  (ZYLOPRIM ) 100 MG tablet Take 1 tablet (100 mg total) by mouth daily. Patient takes in am 05/30/23   Vincent, Duncan Thomas, MD  azelastine (OPTIVAR) 0.05 % ophthalmic solution Apply 1 drop to eye daily as needed. Both eyes 12/08/18   [provider]  buPROPion (WELLBUTRIN XL) 300 MG 24 hr tablet Take 300 mg by mouth daily. 10/01/18   [provider]  cyclobenzaprine (FLEXERIL) 10 MG tablet Take 10 mg by mouth 3 (three) times daily as needed for muscle spasms.    [provider]  diazepam (VALIUM) 5 MG tablet Take 5 mg by mouth 2 (two) times daily as needed.    [provider]  EPINEPHrine  0.3 mg/0.3 mL IJ SOAJ injection Inject into the muscle once. 12/08/18   [provider]  famotidine  (PEPCID ) 20 MG tablet Take 1 tablet (20 mg total) by mouth daily. 06/26/23   Jerrell Cleatus Ned, MD  gabapentin (NEURONTIN) 300 MG capsule Take 300 mg by mouth 3 (three) times daily. 03/14/21   [provider]  gabapentin (NEURONTIN) 600 MG tablet gabapentin 600 mg tablet  Take 1 tablet every day by oral route at bedtime.    [provider]  hydrochlorothiazide  (HYDRODIURIL ) 25 MG tablet Take 1 tablet (25 mg total) by mouth daily. Patient takes in am 05/30/23   Jerrell Cleatus Ned, MD  lisdexamfetamine (VYVANSE) 40 MG capsule Take 40 mg by  mouth every morning.    [provider]  lisinopril  (ZESTRIL ) 10 MG tablet Take 1 tablet (10 mg total) by mouth daily. Patient takes in am 05/30/23   Jerrell Cleatus Ned, MD  meloxicam (MOBIC) 15 MG tablet Take 15 mg by mouth daily as needed. 06/20/18   [provider]  modafinil  (PROVIGIL ) 200 MG tablet Take 1 tablet (200 mg total) by mouth daily as needed. 06/07/23   Jude Harden GAILS, MD  Multiple Vitamin (MULTIVITAMIN) tablet Take 1 tablet by mouth daily.    [provider]  Norgestimate-Ethinyl Estradiol Triphasic 0.18/0.215/0.25 MG-35 MCG tablet Take 1 tablet by mouth daily.    [provider]  PROAIR  RESPICLICK 108 (90 Base) MCG/ACT AEPB Take 1 puff by mouth daily as needed. 12/08/18   [provider]  Vilazodone HCl (VIIBRYD) 40 MG TABS  05/26/23   [provider]    Family History Family History  Problem Relation Age of Onset   Hypertension Mother    Arthritis Mother    Asthma Mother    Hyperlipidemia Mother    Hypertension Father    Heart attack Father    Hyperlipidemia Father    Arthritis Father    Cancer Father    Hearing loss Father    Heart disease Father    Obesity Father    Cancer Maternal Grandmother    COPD Maternal Grandmother    Diabetes Maternal Grandmother    Vision loss Maternal Grandmother    Diabetes Maternal Aunt    Diabetes Maternal Aunt    Stroke Maternal Aunt    Hyperlipidemia Paternal Aunt    Diabetes Maternal Aunt    Diabetes Maternal Aunt    Stroke Maternal Aunt    Hyperlipidemia Paternal Aunt     Social History Social History   Tobacco Use   Smoking status: Former    Current packs/day: 0.00    Average packs/day: 0.5 packs/day for 15.0 years (7.5 ttl pk-yrs)    Types: Cigarettes    Start date: 01/07/1994    Quit date: 01/07/2009    Years since quitting: 14.8   Smokeless tobacco: Never  Substance Use Topics   Alcohol use: Not Currently    Comment: 02/16/2014 might drink 1 drink/month; if  that   Drug use: No     Allergies   Augmentin [amoxicillin-pot clavulanate], Sulfa antibiotics, Amoxicillin, Doxycycline, Sucralose, and Spironolactone   Review of Systems Review of Systems  Musculoskeletal:  Positive for back pain.  Neurological:  Positive for numbness.     Physical Exam Triage Vital Signs ED Triage Vitals  Encounter Vitals Group     BP 11/03/23 1135 (!) 146/86     Girls Systolic BP Percentile --      Girls Diastolic BP Percentile --      Boys Systolic BP Percentile --      Boys Diastolic BP Percentile --      Pulse Rate 11/03/23 1135 94     Resp 11/03/23 1135 18     Temp 11/03/23 1135 97.8 F (36.6 C)     Temp Source 11/03/23 1135 Oral     SpO2 11/03/23 1135 95 %     Weight --      Height --      Head Circumference --      Peak Flow --      Pain Score 11/03/23 1136 9     Pain Loc --      Pain Education --      Exclude from Growth Chart --    No data found.  Updated Vital Signs BP (!) 146/86 (BP Location: Right Arm)   Pulse 94   Temp 97.8 F (36.6 C) (Oral)   Resp 18   SpO2 95%   Visual Acuity Right Eye Distance:   Left Eye Distance:   Bilateral Distance:    Right Eye Near:   Left Eye Near:    Bilateral Near:     Physical Exam Vitals reviewed.  Constitutional:      General: She is awake.     Appearance: Normal appearance. She is well-developed and well-groomed.  HENT:     Head: Normocephalic and atraumatic.  Eyes:     General: Lids are normal. Gaze aligned appropriately.     Extraocular Movements: Extraocular movements intact.     Conjunctiva/sclera: Conjunctivae normal.  Pulmonary:  Effort: Pulmonary effort is normal.  Musculoskeletal:     Thoracic back: No swelling, spasms, tenderness or bony tenderness. Normal range of motion.     Lumbar back: Tenderness present. No swelling, spasms or bony tenderness.       Back:     Comments: Strength findings:  Hip adduction 5/5 bilaterally Hip abduction 5/5 bilaterally   Dorsiflexion 4/5 on right, 5/5 on left Plantar flexion 5/5 bilaterally   Neurological:     General: No focal deficit present.     Mental Status: She is alert and oriented to person, place, and time.  Psychiatric:        Attention and Perception: Attention and perception normal.        Mood and Affect: Mood and affect normal.        Speech: Speech normal.        Behavior: Behavior normal. Behavior is cooperative.        Thought Content: Thought content normal.        Judgment: Judgment normal.      UC Treatments / Results  Labs (all labs ordered are listed, but only abnormal results are displayed) Labs Reviewed - No data to display  EKG   Radiology No results found.  Procedures Procedures (including critical care time)  Medications Ordered in UC Medications  ketorolac  (TORADOL ) 30 MG/ML injection 30 mg (30 mg Intramuscular Given 11/03/23 1228)    Initial Impression / Assessment and Plan / UC Course  I have reviewed the triage vital signs and the nursing notes.  Pertinent labs & imaging results that were available during my care of the patient were reviewed by me and considered in my medical decision making (see chart for details).      Final Clinical Impressions(s) / UC Diagnoses   Final diagnoses:  Acute right-sided low back pain with right-sided sciatica   Low back pain with right-sided sciatica and foot numbness due to herniated lumbar disc at L4-L5, status post prior surgery Chronic low back pain with acute exacerbation, presenting with severe right-sided sciatica and foot numbness. Pain radiates from the lower back to the right buttock, thigh, and calf, with associated numbness and tingling in the foot and calf. Recent increase in severity, possibly due to muscle strain from moving a mattress. Previous surgery at L4-L5 and scheduled for a radio-guided steroid injection. Current management includes NSAIDs and muscle relaxants, with limited relief. Concerns about  opiate use due to work commitments. Hydrocodone  prescribed for short-term use with caution regarding sedation and interaction with other medications. - Administered Toradol  injection for acute pain relief. - Prescribed hydrocodone  for short-term pain management, with caution regarding sedation and interaction with other medications. - Advised against NSAIDs for 48 hours post-Toradol  injection. - Provided instructions for stretches, avoiding activities that may aggravate the condition. - Instructed to seek emergency care if experiencing severe pain, fever, numbness, confusion, rashes, or loss of bowel/bladder control.  Constipation Recent constipation, potentially exacerbated by hydrocodone  use. Reports relief of discomfort after bowel movements. - Advised use of Miralax or fiber supplements to manage constipation.    Discharge Instructions      VISIT SUMMARY:  You came in today due to severe low back pain and sciatica, particularly affecting your right side. The pain has worsened recently, possibly due to moving a mattress. You also reported numbness in your foot and calf, as well as weakness in your right leg. You have a history of surgery on the same site and have been managing the  pain with various medications and yoga.  YOUR PLAN:  -LOW BACK PAIN WITH RIGHT-SIDED SCIATICA AND FOOT NUMBNESS: This condition is due to a herniated lumbar disc at L4-L5, which is pressing on the nerves and causing pain, numbness, and weakness. We administered a Toradol  injection for immediate pain relief and prescribed hydrocodone  for short-term use. Please avoid NSAIDs for 48 hours after the Toradol  injection. Continue with the stretches provided, but avoid activities that worsen your pain. Seek emergency care if you experience severe pain, fever, numbness, confusion, rashes, or loss of bowel/bladder control.  -CONSTIPATION: Constipation can be worsened by pain medications like hydrocodone . We recommend using  Miralax or fiber supplements to help manage this issue.  INSTRUCTIONS:  You are scheduled for a radio-guided steroid injection. Please follow up as planned and seek emergency care if you experience severe pain, fever, numbness, confusion, rashes, or loss of bowel/bladder control.     ED Prescriptions     Medication Sig Dispense Auth. Provider   HYDROcodone -acetaminophen  (NORCO/VICODIN) 5-325 MG tablet Take 2 tablets by mouth every 6 (six) hours as needed for up to 5 days for severe pain (pain score 7-10). 5 tablet Tashema Tiller E, PA-C      I have reviewed the PDMP during this encounter.   Marylene Rocky BRAVO, PA-C 11/03/23 1241

## 2023-11-03 NOTE — Discharge Instructions (Addendum)
 VISIT SUMMARY:  You came in today due to severe low back pain and sciatica, particularly affecting your right side. The pain has worsened recently, possibly due to moving a mattress. You also reported numbness in your foot and calf, as well as weakness in your right leg. You have a history of surgery on the same site and have been managing the pain with various medications and yoga.  YOUR PLAN:  -LOW BACK PAIN WITH RIGHT-SIDED SCIATICA AND FOOT NUMBNESS: This condition is due to a herniated lumbar disc at L4-L5, which is pressing on the nerves and causing pain, numbness, and weakness. We administered a Toradol  injection for immediate pain relief and prescribed hydrocodone  for short-term use. Please avoid NSAIDs for 48 hours after the Toradol  injection. Continue with the stretches provided, but avoid activities that worsen your pain. Seek emergency care if you experience severe pain, fever, numbness, confusion, rashes, or loss of bowel/bladder control.  -CONSTIPATION: Constipation can be worsened by pain medications like hydrocodone . We recommend using Miralax or fiber supplements to help manage this issue.  INSTRUCTIONS:  You are scheduled for a radio-guided steroid injection. Please follow up as planned and seek emergency care if you experience severe pain, fever, numbness, confusion, rashes, or loss of bowel/bladder control.

## 2023-11-07 ENCOUNTER — Ambulatory Visit: Admitting: Student in an Organized Health Care Education/Training Program

## 2023-11-12 ENCOUNTER — Encounter: Payer: Self-pay | Admitting: Student in an Organized Health Care Education/Training Program

## 2023-11-13 NOTE — Telephone Encounter (Signed)
 Patient has responded to your mychart message

## 2023-11-13 NOTE — Telephone Encounter (Signed)
 Patient states she has foot drop and has been seen at [facility] for this and in [place]. Patient states this resolved with PT and is requesting a PT referral.  Patient has an appointment with you on 11/17 but states she may not be able to come due to not being able to drive.

## 2023-11-18 ENCOUNTER — Ambulatory Visit (INDEPENDENT_AMBULATORY_CARE_PROVIDER_SITE_OTHER): Admitting: Student in an Organized Health Care Education/Training Program

## 2023-11-18 VITALS — BP 127/90 | HR 109 | Wt 315.0 lb

## 2023-11-18 DIAGNOSIS — M5441 Lumbago with sciatica, right side: Secondary | ICD-10-CM

## 2023-11-18 DIAGNOSIS — G8929 Other chronic pain: Secondary | ICD-10-CM | POA: Diagnosis not present

## 2023-11-18 DIAGNOSIS — R7303 Prediabetes: Secondary | ICD-10-CM | POA: Diagnosis not present

## 2023-11-18 DIAGNOSIS — I10 Essential (primary) hypertension: Secondary | ICD-10-CM | POA: Diagnosis not present

## 2023-11-18 NOTE — Assessment & Plan Note (Signed)
 A1c improved to 5.8% in September from 6.0%, indicating better glycemic control, likely due to weight loss and increased activity. Continue monitoring blood glucose levels and A1c. Encourage continued lifestyle modifications to maintain glycemic control.

## 2023-11-18 NOTE — Assessment & Plan Note (Signed)
 Chronic low back pain with intermittent right foot drop and lumbar radiculopathy worsened after moving a mattress. A transforaminal epidural steroid injection relieved pain, but foot drop persists, possibly due to lumbar disc herniation affecting the L5 nerve root. Ordered home health physical therapy to address foot drop and improve mobility.  Patient reports that is difficult for her to leave the house because of her foot trip she is not able to drive.  She should continue stretching exercises and massage therapy.

## 2023-11-18 NOTE — Assessment & Plan Note (Signed)
 Blood pressure is slightly elevated, possibly due to recent pain and anxiety, with an elevated heart rate likely due to stress and pain. Continue lisinopril  and hydrochlorothiazide . Monitor blood pressure at home twice a week and record readings for review in three months.  Blood work in September from her physician in [place]  was normal kidney function.

## 2023-11-18 NOTE — Patient Instructions (Signed)
  VISIT SUMMARY: Today, we discussed your worsening foot drop and back pain, which started after moving a mattress. We reviewed your recent treatments, including a steroid injection, and your current medications. We also talked about your peripheral neuropathy, hypertension, obesity, and pre-diabetes. We made a plan to address each of these issues and improve your overall health.  YOUR PLAN: -LOW BACK PAIN WITH RIGHT FOOT DROP AND LUMBAR RADICULOPATHY: Your chronic low back pain and right foot drop worsened after moving a mattress, likely due to a lumbar disc herniation affecting the L5 nerve root. We have ordered home health physical therapy to help with your foot drop and improve your mobility. Please continue with your stretching exercises and massage therapy.  -PERIPHERAL NEUROPATHY, BILATERAL LOWER EXTREMITIES: Peripheral neuropathy means there is damage to the nerves in your lower extremities, causing numbness and altered sensation. We will review your EMG results to determine the cause. Continue managing your symptoms and monitor for any changes.  -ESSENTIAL HYPERTENSION: Your blood pressure is slightly elevated, likely due to recent pain and anxiety. Continue taking your lisinopril  and hydrochlorothiazide . Please monitor your blood pressure at home twice a week and record the readings for our review in three months.  -CLASS 3 SEVERE OBESITY: Severe obesity can impact your overall health. Continue monitoring your weight and making lifestyle changes to support weight loss.  -PRE-DIABETES: Pre-diabetes means your blood sugar levels are higher than normal but not high enough to be classified as diabetes. Your A1c has improved to 5.8%, indicating better blood sugar control. Continue monitoring your blood glucose levels and A1c, and maintain your lifestyle changes to keep your blood sugar under control.  INSTRUCTIONS: Please follow up with home health physical therapy as ordered. Continue monitoring  your blood pressure at home twice a week and record the readings. We will review these readings in three months. Maintain your current lifestyle modifications to support weight loss and glycemic control. If you notice any changes in your symptoms or have any concerns, please contact our office.

## 2023-11-18 NOTE — Progress Notes (Signed)
 Established Patient Office Visit  Patient ID: Katrina Murphy, female    DOB: [birth year]  Age: 48 y.o. MRN: [id number] PCP: Jerrell Cleatus Ned, MD  Chief Complaint  Patient presents with   Medication Management    Follow up  Foot drop and follow up for back  Flu - 09/16/2023 in [place]     Subjective:     HPI  Discussed the use of AI scribe software for clinical note transcription with the patient, who gave verbal consent to proceed.  History of Present Illness Katrina Murphy is a 48 year old female with lumbar disc herniation who presents with worsening foot drop and back pain.  She initially experienced numbness in her toes and foot drop after moving a mattress, which exacerbated her back pain. She had been managing well with physical therapy over the summer until this incident. The numbness and foot drop worsened significantly after the mattress incident, leading to severe back pain that required urgent care and pain medication.  She received a fluoroguided steroid injection in her lumbar spine. However, the pain and foot drop worsened before the injection, and she had to use a step stool to get in and out of bed. Post-injection, the pain improved significantly, but the foot drop persisted, affecting her ability to drive and perform daily activities.  She has been using meloxicam daily for pain management, whereas previously it was used as needed. Her dexamfetamine dose was increased to 50 mg. She has been engaging in stretching exercises and received massages, which have helped alleviate some symptoms. She is unable to drive due to the foot drop, relying on delivery services for groceries and assistance from friends for appointments.  She has a history of kidney stones, having experienced several in the past. Her recent blood work showed a decrease in A1c from 6% to 5.8%, and she has been taking sublingual B12 supplements due to low levels. She feels more stable  walking barefoot than with shoes, and neuropathy was identified in an EMG as affecting her other foot as well.  She feels discouraged due to her inability to drive and the impact on her independence, including missing family events. She lives alone and works remotely, which adds to the challenge of managing her condition.     Objective:     BP (!) 127/90   Pulse (!) 109   Wt (!) 315 lb (142.9 kg)   BMI 54.78 kg/m   Physical Exam  Gen: Well-appearing woman Heart: Regular in auscultation, no murmur Lungs: Unlabored, clear anteriorly Ext: Warm, trace edema in both legs Neuro: Alert, conversational, he has full strength in both lower extremities except for weakness in dorsiflexion of the right foot.  Gait is moderately antalgic.     Assessment & Plan:   Problem List Items Addressed This Visit       High   Hypertension (Chronic)   Blood pressure is slightly elevated, possibly due to recent pain and anxiety, with an elevated heart rate likely due to stress and pain. Continue lisinopril  and hydrochlorothiazide . Monitor blood pressure at home twice a week and record readings for review in three months.  Blood work in September from her physician in [place]  was normal kidney function.        Medium    Pre-diabetes (Chronic)   A1c improved to 5.8% in September from 6.0%, indicating better glycemic control, likely due to weight loss and increased activity. Continue monitoring blood glucose levels and A1c. Encourage continued lifestyle  modifications to maintain glycemic control.        Low   Chronic low back pain - Primary (Chronic)   Chronic low back pain with intermittent right foot drop and lumbar radiculopathy worsened after moving a mattress. A transforaminal epidural steroid injection relieved pain, but foot drop persists, possibly due to lumbar disc herniation affecting the L5 nerve root. Ordered home health physical therapy to address foot drop and improve mobility.  Patient  reports that is difficult for her to leave the house because of her foot trip she is not able to drive.  She should continue stretching exercises and massage therapy.      Relevant Orders   Ambulatory referral to Home Health    Return in about 3 months (around 02/18/2024).    Cleatus Debby Specking, MD [facility]

## 2023-12-03 ENCOUNTER — Telehealth: Payer: Self-pay

## 2023-12-03 NOTE — Telephone Encounter (Signed)
 After talking to patient on the phone she states seh did reach out to [facility] physical therapy and they stated the soonest they can get her in is January. Patient stated she has currently met her deductible and has the money arranged to cover for this year for physical therapy but come next year this is going to be a little harder. Patient would like to try and get physical therapy started before than to start getting range of motion back.   [facility] needed the referral faxed to them and I am not sure if this was completed. I have faxed this referral to [facility] today. Patient has tried to call this therapy location but has not received a response.   Patient is wondering if we can resend this referral else where?

## 2023-12-03 NOTE — Telephone Encounter (Signed)
 Copied from CRM [id number]. Topic: Referral - Status >> Dec 03, 2023 11:47 AM Charolett L wrote: Reason for CRM: patient is calling for referral status and no one ever reached back out to her and she's contacted both offices. Patient is requesting a call back in reference to the PT referral because she needs to be seen this year CB# [phone]

## 2023-12-04 NOTE — Telephone Encounter (Signed)
 Called patient and left her a message regarding home health referral. Multiple agencies are not accepting her insurance. Patient needs to contact her insurance for assistance as to what agencies are in-network with her insurance.

## 2023-12-05 ENCOUNTER — Encounter: Payer: Self-pay | Admitting: Student in an Organized Health Care Education/Training Program

## 2023-12-06 NOTE — Telephone Encounter (Signed)
 Patient is wondering if we can just print the referral order off for her and she take them and look for a PT place herself?

## 2023-12-17 ENCOUNTER — Other Ambulatory Visit: Payer: Self-pay | Admitting: Student in an Organized Health Care Education/Training Program

## 2023-12-17 ENCOUNTER — Telehealth: Payer: Self-pay | Admitting: Student in an Organized Health Care Education/Training Program

## 2023-12-17 DIAGNOSIS — N200 Calculus of kidney: Secondary | ICD-10-CM

## 2023-12-17 NOTE — Telephone Encounter (Signed)
 Type of form received: Home Health Certification and Plan of Care  Additional comments: Last visit was 11/18/23  Received by: Fax  Form should be Faxed/mailed to: (address/ fax #) [phone]  Is patient requesting call for pickup:  Form placed:  Provider's bin  Attach charge sheet.  Provider will determine charge.  Individual made aware of 3-5 business day turn around Yes?

## 2023-12-17 NOTE — Telephone Encounter (Signed)
 Please see message below

## 2023-12-18 ENCOUNTER — Encounter (HOSPITAL_BASED_OUTPATIENT_CLINIC_OR_DEPARTMENT_OTHER): Payer: Self-pay | Admitting: Pulmonary Disease

## 2023-12-18 DIAGNOSIS — G4733 Obstructive sleep apnea (adult) (pediatric): Secondary | ICD-10-CM

## 2023-12-18 NOTE — Telephone Encounter (Signed)
 Please advise on replacement machine for cpap

## 2023-12-18 NOTE — Telephone Encounter (Signed)
 Katrina Murphy faxed and scanned documents

## 2023-12-18 NOTE — Telephone Encounter (Signed)
 Signed and placed in MA folder.  Thank you.

## 2023-12-18 NOTE — Telephone Encounter (Signed)
Forms placed on providers desk to be signed.

## 2023-12-19 NOTE — Telephone Encounter (Signed)
 Order CPAP.

## 2023-12-20 ENCOUNTER — Other Ambulatory Visit: Payer: Self-pay

## 2023-12-20 DIAGNOSIS — K219 Gastro-esophageal reflux disease without esophagitis: Secondary | ICD-10-CM

## 2023-12-20 NOTE — Progress Notes (Unsigned)
 I think this message is saying that famotidine  is not covered by the person's insurance.  Good news is famotidine  can be safely purchased over-the-counter.  I do not think the alternatives listed like cimetidine or a famotidine  suspension are medically needed.  I would recommend the famotidine  20 mg tablet over-the-counter daily as needed for reflux symptoms.

## 2023-12-23 ENCOUNTER — Telehealth: Payer: Self-pay

## 2023-12-23 ENCOUNTER — Other Ambulatory Visit: Payer: Self-pay

## 2023-12-23 DIAGNOSIS — K219 Gastro-esophageal reflux disease without esophagitis: Secondary | ICD-10-CM

## 2023-12-23 MED ORDER — FAMOTIDINE 20 MG PO TABS: 20.0000 mg | ORAL_TABLET | Freq: Every day | ORAL | 1 refills | Status: AC

## 2023-12-23 NOTE — Telephone Encounter (Signed)
 Resent medication to [employer]. Patient is going to pay out of pocket for medication

## 2023-12-23 NOTE — Telephone Encounter (Signed)
 Copied from CRM [id number]. Topic: Clinical - Prescription Issue >> Dec 23, 2023 12:36 PM Chasity T wrote: Reason for CRM: patient returning call for [facility] regarding medication pepcide of 20 mg. Advised that she can purchase OTC due to insurance not but patient wants a prescription to get it cheaper, states its $45 without insurance but with insurance only $6.

## 2023-12-23 NOTE — Progress Notes (Signed)
 Called patient and left vm to return call. Patient can purchase Pepcid  OTC. She can take 20mg  as needed

## 2023-12-23 NOTE — Telephone Encounter (Signed)
 Please view other phone note regarding this

## 2024-02-02 ENCOUNTER — Other Ambulatory Visit: Payer: Self-pay | Admitting: Student in an Organized Health Care Education/Training Program

## 2024-02-02 DIAGNOSIS — I10 Essential (primary) hypertension: Secondary | ICD-10-CM

## 2024-02-17 ENCOUNTER — Ambulatory Visit: Admitting: Student in an Organized Health Care Education/Training Program
